# Patient Record
Sex: Male | Born: 1950 | Hispanic: Yes | Marital: Married | Smoking: Former smoker
Health system: Southern US, Community
[De-identification: ages and names within clinical notes are randomized; demographics above are authoritative.]

## PROBLEM LIST (undated history)

## (undated) DIAGNOSIS — N200 Calculus of kidney: Secondary | ICD-10-CM

## (undated) DIAGNOSIS — I10 Essential (primary) hypertension: Secondary | ICD-10-CM

## (undated) DIAGNOSIS — D589 Hereditary hemolytic anemia, unspecified: Secondary | ICD-10-CM

## (undated) HISTORY — DX: Calculus of kidney: N20.0

## (undated) HISTORY — DX: Hereditary hemolytic anemia, unspecified: D58.9

## (undated) HISTORY — PX: CHOLECYSTECTOMY: SHX55

## (undated) HISTORY — PX: SPLENECTOMY: SUR1306

## (undated) HISTORY — PX: APPENDECTOMY: SHX54

## (undated) HISTORY — PX: VOLUME STUDY: SHX6646

---

## 2006-01-03 ENCOUNTER — Ambulatory Visit: Payer: Self-pay | Admitting: Urology

## 2011-05-21 ENCOUNTER — Emergency Department: Payer: Self-pay | Admitting: Emergency Medicine

## 2011-05-21 LAB — BASIC METABOLIC PANEL
BUN: 25 mg/dL — ABNORMAL HIGH (ref 7–18)
Co2: 25 mmol/L (ref 21–32)
EGFR (African American): >60
Glucose: 139 mg/dL — ABNORMAL HIGH (ref 65–99)
Osmolality: 290 (ref 275–301)
Sodium: 142 mmol/L (ref 136–145)

## 2011-05-21 LAB — URINALYSIS, COMPLETE
Glucose,UR: NEGATIVE mg/dL (ref 0–75)
Ketone: NEGATIVE
Leukocyte Esterase: NEGATIVE
RBC,UR: 4 /HPF (ref 0–5)
Specific Gravity: 1.026 (ref 1.003–1.030)
Squamous Epithelial: NONE SEEN
WBC UR: <1 /HPF (ref 0–5)

## 2011-05-21 LAB — CBC
HCT: 31 % — ABNORMAL LOW (ref 40.0–52.0)
MCHC: 33.8 g/dL (ref 32.0–36.0)
Platelet: 255 10*3/uL (ref 150–440)
RBC: 2.88 10*6/uL — ABNORMAL LOW (ref 4.40–5.90)
RDW: 16.1 % — ABNORMAL HIGH (ref 11.5–14.5)
WBC: 9.3 10*3/uL (ref 3.8–10.6)

## 2012-11-22 ENCOUNTER — Ambulatory Visit: Payer: Self-pay | Admitting: Hematology and Oncology

## 2012-11-22 LAB — COMPREHENSIVE METABOLIC PANEL
Alkaline Phosphatase: 76 U/L (ref 50–136)
BUN: 15 mg/dL (ref 7–18)
Bilirubin,Total: 4.8 mg/dL — ABNORMAL HIGH (ref 0.2–1.0)
Chloride: 111 mmol/L — ABNORMAL HIGH (ref 98–107)
Co2: 26 mmol/L (ref 21–32)
EGFR (African American): >60
EGFR (Non-African Amer.): >60
Osmolality: 282 (ref 275–301)
Potassium: 3.8 mmol/L (ref 3.5–5.1)
SGOT(AST): 83 U/L — ABNORMAL HIGH (ref 15–37)
SGPT (ALT): 28 U/L (ref 12–78)
Total Protein: 7.3 g/dL (ref 6.4–8.2)

## 2012-11-22 LAB — CBC CANCER CENTER
Bands: 1 %
Basophil: 1 %
Comment - H1-Com4: NORMAL
Eosinophil: 3 %
HCT: 22.3 % — ABNORMAL LOW (ref 40.0–52.0)
HGB: 7.4 g/dL — ABNORMAL LOW (ref 13.0–18.0)
Lymphocytes: 19 %
MCHC: 33 g/dL (ref 32.0–36.0)
MCV: 124 fL — ABNORMAL HIGH (ref 80–100)
Monocytes: 7 %
NRBC/100 WBC: 17 /100
RBC: 1.79 10*6/uL — ABNORMAL LOW (ref 4.40–5.90)
Segmented Neutrophils: 69 %
WBC: 5.9 x10 3/mm (ref 3.8–10.6)

## 2012-11-22 LAB — RETICULOCYTES: Reticulocyte: >27.4 % (ref 0.4–3.1)

## 2012-11-22 LAB — BILIRUBIN, TOTAL: Bilirubin,Total: 4.9 mg/dL — ABNORMAL HIGH (ref 0.2–1.0)

## 2012-11-22 LAB — BILIRUBIN, DIRECT: Bilirubin, Direct: 0.4 mg/dL — ABNORMAL HIGH (ref 0.00–0.20)

## 2012-11-29 LAB — CBC CANCER CENTER
Basophil #: 0.1 "x10 3/mm "
Basophil %: 0.8 %
Eosinophil #: 0 "x10 3/mm "
Eosinophil %: 0.4 %
HCT: 29.7 % — ABNORMAL LOW
HGB: 9.3 g/dL — ABNORMAL LOW
Lymphocyte %: 24.4 %
Lymphs Abs: 2 "x10 3/mm "
MCH: 38 pg — ABNORMAL HIGH
MCHC: 31.4 g/dL — ABNORMAL LOW
MCV: 121 fL — ABNORMAL HIGH
Monocyte #: 0.6 "x10 3/mm "
Monocyte %: 7.2 %
Neutrophil #: 5.5 "x10 3/mm "
Neutrophil %: 67.2 %
Platelet: 203 "x10 3/mm "
RBC: 2.46 "x10 6/mm " — ABNORMAL LOW
RDW: 21 % — ABNORMAL HIGH
WBC: 8.1 "x10 3/mm "

## 2012-11-29 LAB — BASIC METABOLIC PANEL WITH GFR
Anion Gap: 8
BUN: 23 mg/dL — ABNORMAL HIGH
Calcium, Total: 7.9 mg/dL — ABNORMAL LOW
Chloride: 104 mmol/L
Co2: 29 mmol/L
Creatinine: 0.98 mg/dL
EGFR (African American): >60
EGFR (Non-African Amer.): >60
Glucose: 91 mg/dL
Osmolality: 285
Potassium: 3.2 mmol/L — ABNORMAL LOW
Sodium: 141 mmol/L

## 2012-12-06 LAB — CBC CANCER CENTER
Eosinophil %: 0.3 %
HCT: 34.5 % — ABNORMAL LOW (ref 40.0–52.0)
MCH: 35.9 pg — ABNORMAL HIGH (ref 26.0–34.0)
Monocyte #: 0.3 x10 3/mm (ref 0.2–1.0)
Monocyte %: 4 %
Neutrophil #: 6.4 x10 3/mm (ref 1.4–6.5)
Neutrophil %: 88.5 %
Platelet: 214 x10 3/mm (ref 150–440)
RBC: 3.17 10*6/uL — ABNORMAL LOW (ref 4.40–5.90)
WBC: 7.2 x10 3/mm (ref 3.8–10.6)

## 2012-12-09 ENCOUNTER — Ambulatory Visit: Payer: Self-pay | Admitting: Hematology and Oncology

## 2012-12-13 LAB — CBC CANCER CENTER
Basophil #: 0.1 x10 3/mm (ref 0.0–0.1)
Basophil %: 0.8 %
Eosinophil #: 0.1 x10 3/mm (ref 0.0–0.7)
Eosinophil %: 0.9 %
HCT: 35.3 % — ABNORMAL LOW (ref 40.0–52.0)
Lymphocyte #: 1 x10 3/mm (ref 1.0–3.6)
MCH: 35.2 pg — ABNORMAL HIGH (ref 26.0–34.0)
MCV: 105 fL — ABNORMAL HIGH (ref 80–100)
Monocyte #: 0.5 x10 3/mm (ref 0.2–1.0)
Monocyte %: 5.3 %
Neutrophil #: 6.9 x10 3/mm — ABNORMAL HIGH (ref 1.4–6.5)
Neutrophil %: 81.2 %
WBC: 8.6 x10 3/mm (ref 3.8–10.6)

## 2012-12-27 LAB — CBC CANCER CENTER
Eosinophil #: 0.1 x10 3/mm (ref 0.0–0.7)
Eosinophil %: 1.9 %
HGB: 11.1 g/dL — ABNORMAL LOW (ref 13.0–18.0)
Lymphocyte #: 1.6 x10 3/mm (ref 1.0–3.6)
Lymphocyte %: 25.2 %
MCH: 33.5 pg (ref 26.0–34.0)
MCV: 99 fL (ref 80–100)
Neutrophil %: 66.5 %
Platelet: 305 x10 3/mm (ref 150–440)
RBC: 3.3 10*6/uL — ABNORMAL LOW (ref 4.40–5.90)
RDW: 18.1 % — ABNORMAL HIGH (ref 11.5–14.5)
WBC: 6.2 x10 3/mm (ref 3.8–10.6)

## 2012-12-27 LAB — BASIC METABOLIC PANEL
BUN: 16 mg/dL (ref 7–18)
Chloride: 104 mmol/L (ref 98–107)
Co2: 28 mmol/L (ref 21–32)
Creatinine: 0.82 mg/dL (ref 0.60–1.30)
EGFR (African American): >60
Potassium: 3.4 mmol/L — ABNORMAL LOW (ref 3.5–5.1)
Sodium: 140 mmol/L (ref 136–145)

## 2013-01-09 ENCOUNTER — Ambulatory Visit: Payer: Self-pay | Admitting: Hematology and Oncology

## 2013-01-10 LAB — CBC CANCER CENTER
BASOS ABS: 0.1 x10 3/mm (ref 0.0–0.1)
Basophil %: 0.9 %
EOS PCT: 1.6 %
Eosinophil #: 0.1 x10 3/mm (ref 0.0–0.7)
HCT: 33.5 % — AB (ref 40.0–52.0)
HGB: 11.4 g/dL — ABNORMAL LOW (ref 13.0–18.0)
Lymphocyte #: 1.8 x10 3/mm (ref 1.0–3.6)
Lymphocyte %: 25.4 %
MCH: 33.6 pg (ref 26.0–34.0)
MCHC: 34.1 g/dL (ref 32.0–36.0)
MCV: 99 fL (ref 80–100)
MONO ABS: 0.3 x10 3/mm (ref 0.2–1.0)
Monocyte %: 4.8 %
NEUTROS PCT: 67.3 %
Neutrophil #: 4.8 x10 3/mm (ref 1.4–6.5)
Platelet: 238 x10 3/mm (ref 150–440)
RBC: 3.4 10*6/uL — ABNORMAL LOW (ref 4.40–5.90)
RDW: 18.9 % — ABNORMAL HIGH (ref 11.5–14.5)
WBC: 7.2 x10 3/mm (ref 3.8–10.6)

## 2013-01-24 LAB — CBC CANCER CENTER
Basophil #: 0.1 x10 3/mm (ref 0.0–0.1)
Basophil %: 1.5 %
EOS ABS: 0.2 x10 3/mm (ref 0.0–0.7)
EOS PCT: 2.8 %
HCT: 31.3 % — AB (ref 40.0–52.0)
HGB: 10.5 g/dL — ABNORMAL LOW (ref 13.0–18.0)
LYMPHS ABS: 1.4 x10 3/mm (ref 1.0–3.6)
Lymphocyte %: 16.6 %
MCH: 35 pg — AB (ref 26.0–34.0)
MCHC: 33.5 g/dL (ref 32.0–36.0)
MCV: 104 fL — AB (ref 80–100)
MONOS PCT: 5.4 %
Monocyte #: 0.5 x10 3/mm (ref 0.2–1.0)
NEUTROS PCT: 73.7 %
Neutrophil #: 6.2 x10 3/mm (ref 1.4–6.5)
Platelet: 219 x10 3/mm (ref 150–440)
RBC: 3 10*6/uL — ABNORMAL LOW (ref 4.40–5.90)
RDW: 18.8 % — AB (ref 11.5–14.5)
WBC: 8.4 x10 3/mm (ref 3.8–10.6)

## 2013-02-07 LAB — CBC CANCER CENTER
Basophil #: 0.1 x10 3/mm (ref 0.0–0.1)
Basophil %: 1.3 %
EOS PCT: 1.3 %
Eosinophil #: 0.1 x10 3/mm (ref 0.0–0.7)
HCT: 32.8 % — AB (ref 40.0–52.0)
HGB: 10.6 g/dL — ABNORMAL LOW (ref 13.0–18.0)
LYMPHS PCT: 22.2 %
Lymphocyte #: 1.5 x10 3/mm (ref 1.0–3.6)
MCH: 31.4 pg (ref 26.0–34.0)
MCHC: 32.3 g/dL (ref 32.0–36.0)
MCV: 97 fL (ref 80–100)
Monocyte #: 0.3 x10 3/mm (ref 0.2–1.0)
Monocyte %: 4.9 %
NEUTROS PCT: 70.3 %
Neutrophil #: 4.7 x10 3/mm (ref 1.4–6.5)
PLATELETS: 374 x10 3/mm (ref 150–440)
RBC: 3.37 10*6/uL — AB (ref 4.40–5.90)
RDW: 19 % — ABNORMAL HIGH (ref 11.5–14.5)
WBC: 6.7 x10 3/mm (ref 3.8–10.6)

## 2013-02-07 LAB — COMPREHENSIVE METABOLIC PANEL
ALK PHOS: 70 U/L
Albumin: 4 g/dL (ref 3.4–5.0)
Anion Gap: 3 — ABNORMAL LOW (ref 7–16)
BUN: 17 mg/dL (ref 7–18)
Bilirubin,Total: 1.2 mg/dL — ABNORMAL HIGH (ref 0.2–1.0)
CALCIUM: 8.9 mg/dL (ref 8.5–10.1)
CO2: 29 mmol/L (ref 21–32)
CREATININE: 0.96 mg/dL (ref 0.60–1.30)
Chloride: 106 mmol/L (ref 98–107)
EGFR (African American): >60
EGFR (Non-African Amer.): >60
Glucose: 87 mg/dL (ref 65–99)
Osmolality: 277 (ref 275–301)
Potassium: 3.6 mmol/L (ref 3.5–5.1)
SGOT(AST): 27 U/L (ref 15–37)
SGPT (ALT): 18 U/L (ref 12–78)
Sodium: 138 mmol/L (ref 136–145)
TOTAL PROTEIN: 7.4 g/dL (ref 6.4–8.2)

## 2013-02-09 ENCOUNTER — Ambulatory Visit: Payer: Self-pay | Admitting: Hematology and Oncology

## 2013-02-14 LAB — CBC CANCER CENTER
BASOS ABS: 0 x10 3/mm (ref 0.0–0.1)
BASOS PCT: 0.1 %
Eosinophil #: 0 x10 3/mm (ref 0.0–0.7)
Eosinophil %: 0 %
HCT: 37.2 % — ABNORMAL LOW (ref 40.0–52.0)
HGB: 12.3 g/dL — ABNORMAL LOW (ref 13.0–18.0)
LYMPHS PCT: 4.4 %
Lymphocyte #: 0.4 x10 3/mm — ABNORMAL LOW (ref 1.0–3.6)
MCH: 32.1 pg (ref 26.0–34.0)
MCHC: 33 g/dL (ref 32.0–36.0)
MCV: 97 fL (ref 80–100)
Monocyte #: 0.1 x10 3/mm — ABNORMAL LOW (ref 0.2–1.0)
Monocyte %: 0.6 %
Neutrophil #: 7.9 x10 3/mm — ABNORMAL HIGH (ref 1.4–6.5)
Neutrophil %: 94.9 %
Platelet: 309 x10 3/mm (ref 150–440)
RBC: 3.82 10*6/uL — AB (ref 4.40–5.90)
RDW: 20.1 % — AB (ref 11.5–14.5)
WBC: 8.4 x10 3/mm (ref 3.8–10.6)

## 2013-02-21 LAB — CBC CANCER CENTER
BASOS ABS: 0.1 x10 3/mm (ref 0.0–0.1)
BASOS PCT: 0.9 %
EOS ABS: 0.1 x10 3/mm (ref 0.0–0.7)
EOS PCT: 1.4 %
HCT: 38 % — AB (ref 40.0–52.0)
HGB: 13.3 g/dL (ref 13.0–18.0)
LYMPHS ABS: 1.3 x10 3/mm (ref 1.0–3.6)
Lymphocyte %: 19.7 %
MCH: 34.2 pg — AB (ref 26.0–34.0)
MCHC: 35.1 g/dL (ref 32.0–36.0)
MCV: 98 fL (ref 80–100)
MONO ABS: 0.5 x10 3/mm (ref 0.2–1.0)
Monocyte %: 7 %
Neutrophil #: 4.7 x10 3/mm (ref 1.4–6.5)
Neutrophil %: 71 %
Platelet: 210 x10 3/mm (ref 150–440)
RBC: 3.89 10*6/uL — AB (ref 4.40–5.90)
RDW: 18.6 % — ABNORMAL HIGH (ref 11.5–14.5)
WBC: 6.6 x10 3/mm (ref 3.8–10.6)

## 2013-03-09 ENCOUNTER — Ambulatory Visit: Payer: Self-pay | Admitting: Hematology and Oncology

## 2013-03-28 LAB — CBC CANCER CENTER
BASOS ABS: 0.1 x10 3/mm (ref 0.0–0.1)
Basophil %: 0.8 %
Eosinophil #: 0.1 x10 3/mm (ref 0.0–0.7)
Eosinophil %: 1.2 %
HCT: 32.2 % — AB (ref 40.0–52.0)
HGB: 10.7 g/dL — ABNORMAL LOW (ref 13.0–18.0)
LYMPHS ABS: 1 x10 3/mm (ref 1.0–3.6)
Lymphocyte %: 10.8 %
MCH: 36.2 pg — ABNORMAL HIGH (ref 26.0–34.0)
MCHC: 33.3 g/dL (ref 32.0–36.0)
MCV: 109 fL — ABNORMAL HIGH (ref 80–100)
Monocyte #: 0.3 x10 3/mm (ref 0.2–1.0)
Monocyte %: 2.9 %
NEUTROS ABS: 7.5 x10 3/mm — AB (ref 1.4–6.5)
NEUTROS PCT: 84.3 %
Platelet: 175 x10 3/mm (ref 150–440)
RBC: 2.97 10*6/uL — ABNORMAL LOW (ref 4.40–5.90)
RDW: 17.6 % — ABNORMAL HIGH (ref 11.5–14.5)
WBC: 8.9 x10 3/mm (ref 3.8–10.6)

## 2013-04-09 ENCOUNTER — Ambulatory Visit: Payer: Self-pay | Admitting: Hematology and Oncology

## 2013-05-30 ENCOUNTER — Ambulatory Visit: Payer: Self-pay | Admitting: Hematology and Oncology

## 2013-06-09 ENCOUNTER — Ambulatory Visit: Payer: Self-pay | Admitting: Hematology and Oncology

## 2013-07-24 ENCOUNTER — Ambulatory Visit: Payer: Self-pay | Admitting: Hematology and Oncology

## 2013-08-15 ENCOUNTER — Ambulatory Visit: Payer: Self-pay | Admitting: Internal Medicine

## 2013-08-18 ENCOUNTER — Ambulatory Visit: Payer: Self-pay | Admitting: Hematology and Oncology

## 2013-08-25 ENCOUNTER — Ambulatory Visit: Payer: Self-pay | Admitting: Surgery

## 2013-08-25 DIAGNOSIS — R5383 Other fatigue: Secondary | ICD-10-CM

## 2013-08-25 DIAGNOSIS — R5381 Other malaise: Secondary | ICD-10-CM

## 2013-08-25 DIAGNOSIS — D509 Iron deficiency anemia, unspecified: Secondary | ICD-10-CM

## 2013-08-25 LAB — PROTIME-INR
INR: 1
PROTHROMBIN TIME: 13.2 s (ref 11.5–14.7)

## 2013-08-26 LAB — BASIC METABOLIC PANEL

## 2013-09-02 LAB — CBC CANCER CENTER
BANDS NEUTROPHIL: 1 %
HCT: 30.1 % — ABNORMAL LOW (ref 40.0–52.0)
HGB: 9.9 g/dL — AB (ref 13.0–18.0)
Lymphocytes: 24 %
MCH: 36.5 pg — ABNORMAL HIGH (ref 26.0–34.0)
MCHC: 33 g/dL (ref 32.0–36.0)
MCV: 111 fL — ABNORMAL HIGH (ref 80–100)
MONOS PCT: 5 %
NRBC/100 WBC: 1 /100
Platelet: 320 x10 3/mm (ref 150–440)
RBC: 2.71 10*6/uL — AB (ref 4.40–5.90)
RDW: 20 % — ABNORMAL HIGH (ref 11.5–14.5)
Segmented Neutrophils: 70 %
WBC: 9.8 x10 3/mm (ref 3.8–10.6)

## 2013-09-09 ENCOUNTER — Ambulatory Visit: Payer: Self-pay | Admitting: Hematology and Oncology

## 2013-09-16 ENCOUNTER — Inpatient Hospital Stay: Payer: Self-pay | Admitting: Surgery

## 2013-09-16 LAB — CBC WITH DIFFERENTIAL/PLATELET
BASOS ABS: 0 10*3/uL (ref 0.0–0.1)
Basophil %: 0.1 %
EOS ABS: 0 10*3/uL (ref 0.0–0.7)
Eosinophil %: 0 %
HCT: 32.8 % — ABNORMAL LOW (ref 40.0–52.0)
HGB: 10.8 g/dL — ABNORMAL LOW (ref 13.0–18.0)
LYMPHS PCT: 2.4 %
Lymphocyte #: 0.5 10*3/uL — ABNORMAL LOW (ref 1.0–3.6)
MCH: 34.7 pg — AB (ref 26.0–34.0)
MCHC: 32.9 g/dL (ref 32.0–36.0)
MCV: 106 fL — AB (ref 80–100)
MONOS PCT: 4.5 %
Monocyte #: 0.9 x10 3/mm (ref 0.2–1.0)
NEUTROS ABS: 19.2 10*3/uL — AB (ref 1.4–6.5)
Neutrophil %: 93 %
Platelet: 283 10*3/uL (ref 150–440)
RBC: 3.11 10*6/uL — AB (ref 4.40–5.90)
RDW: 17.8 % — ABNORMAL HIGH (ref 11.5–14.5)
WBC: 20.7 10*3/uL — AB (ref 3.8–10.6)

## 2013-09-17 LAB — CBC WITH DIFFERENTIAL/PLATELET
BASOS ABS: 0 10*3/uL (ref 0.0–0.1)
Basophil %: 0.1 %
EOS PCT: 0.1 %
Eosinophil #: 0 10*3/uL (ref 0.0–0.7)
HCT: 32.8 % — ABNORMAL LOW (ref 40.0–52.0)
HGB: 10.3 g/dL — AB (ref 13.0–18.0)
Lymphocyte #: 0.9 10*3/uL — ABNORMAL LOW (ref 1.0–3.6)
Lymphocyte %: 6.9 %
MCH: 33.5 pg (ref 26.0–34.0)
MCHC: 31.4 g/dL — ABNORMAL LOW (ref 32.0–36.0)
MCV: 107 fL — ABNORMAL HIGH (ref 80–100)
MONO ABS: 0.8 x10 3/mm (ref 0.2–1.0)
Monocyte %: 6.6 %
NEUTROS PCT: 86.3 %
Neutrophil #: 10.7 10*3/uL — ABNORMAL HIGH (ref 1.4–6.5)
Platelet: 289 10*3/uL (ref 150–440)
RBC: 3.07 10*6/uL — ABNORMAL LOW (ref 4.40–5.90)
RDW: 18.2 % — ABNORMAL HIGH (ref 11.5–14.5)
WBC: 12.4 10*3/uL — AB (ref 3.8–10.6)

## 2013-09-17 LAB — BASIC METABOLIC PANEL
ANION GAP: 7 (ref 7–16)
BUN: 12 mg/dL (ref 7–18)
CALCIUM: 8.1 mg/dL — AB (ref 8.5–10.1)
Chloride: 103 mmol/L (ref 98–107)
Co2: 28 mmol/L (ref 21–32)
Creatinine: 0.81 mg/dL (ref 0.60–1.30)
EGFR (African American): >60
Glucose: 124 mg/dL — ABNORMAL HIGH (ref 65–99)
Osmolality: 277 (ref 275–301)
Potassium: 3.8 mmol/L (ref 3.5–5.1)
SODIUM: 138 mmol/L (ref 136–145)

## 2013-09-18 LAB — CBC WITH DIFFERENTIAL/PLATELET
BASOS PCT: 0.1 %
Basophil #: 0 10*3/uL (ref 0.0–0.1)
Eosinophil #: 0 10*3/uL (ref 0.0–0.7)
Eosinophil %: 0 %
HCT: 34.5 % — ABNORMAL LOW (ref 40.0–52.0)
HGB: 11.2 g/dL — AB (ref 13.0–18.0)
LYMPHS PCT: 2 %
Lymphocyte #: 0.2 10*3/uL — ABNORMAL LOW (ref 1.0–3.6)
MCH: 34.7 pg — AB (ref 26.0–34.0)
MCHC: 32.4 g/dL (ref 32.0–36.0)
MCV: 107 fL — AB (ref 80–100)
MONO ABS: 0.2 x10 3/mm (ref 0.2–1.0)
Monocyte %: 2.5 %
NEUTROS ABS: 9.3 10*3/uL — AB (ref 1.4–6.5)
Neutrophil %: 95.4 %
PLATELETS: 332 10*3/uL (ref 150–440)
RBC: 3.22 10*6/uL — ABNORMAL LOW (ref 4.40–5.90)
RDW: 17.4 % — AB (ref 11.5–14.5)
WBC: 9.8 10*3/uL (ref 3.8–10.6)

## 2013-09-18 LAB — CREATININE, SERUM
CREATININE: 0.79 mg/dL (ref 0.60–1.30)
EGFR (African American): >60

## 2013-09-18 LAB — BILIRUBIN, TOTAL: Bilirubin,Total: 0.9 mg/dL (ref 0.2–1.0)

## 2013-09-19 LAB — CBC WITH DIFFERENTIAL/PLATELET
Basophil #: 0 10*3/uL (ref 0.0–0.1)
Basophil %: 0.2 %
Eosinophil #: 0 10*3/uL (ref 0.0–0.7)
Eosinophil %: 0.3 %
HCT: 32.9 % — ABNORMAL LOW (ref 40.0–52.0)
HGB: 11.1 g/dL — ABNORMAL LOW (ref 13.0–18.0)
LYMPHS PCT: 12.7 %
Lymphocyte #: 1.4 10*3/uL (ref 1.0–3.6)
MCH: 35.5 pg — ABNORMAL HIGH (ref 26.0–34.0)
MCHC: 33.8 g/dL (ref 32.0–36.0)
MCV: 105 fL — AB (ref 80–100)
MONOS PCT: 9 %
Monocyte #: 1 x10 3/mm (ref 0.2–1.0)
NEUTROS PCT: 77.8 %
Neutrophil #: 8.5 10*3/uL — ABNORMAL HIGH (ref 1.4–6.5)
Platelet: 363 10*3/uL (ref 150–440)
RBC: 3.13 10*6/uL — AB (ref 4.40–5.90)
RDW: 16.9 % — ABNORMAL HIGH (ref 11.5–14.5)
WBC: 11 10*3/uL — ABNORMAL HIGH (ref 3.8–10.6)

## 2013-09-22 LAB — PATHOLOGY REPORT

## 2013-10-09 ENCOUNTER — Ambulatory Visit: Payer: Self-pay | Admitting: Hematology and Oncology

## 2013-12-10 ENCOUNTER — Ambulatory Visit: Payer: Self-pay | Admitting: Internal Medicine

## 2013-12-28 LAB — COMPREHENSIVE METABOLIC PANEL
AST: 248 U/L — AB (ref 15–37)
Albumin: 3.4 g/dL (ref 3.4–5.0)
Alkaline Phosphatase: 337 U/L — ABNORMAL HIGH
Anion Gap: 9 (ref 7–16)
BUN: 12 mg/dL (ref 7–18)
Bilirubin,Total: 9.9 mg/dL — ABNORMAL HIGH (ref 0.2–1.0)
CALCIUM: 8.9 mg/dL (ref 8.5–10.1)
CHLORIDE: 105 mmol/L (ref 98–107)
CREATININE: 0.9 mg/dL (ref 0.60–1.30)
Co2: 24 mmol/L (ref 21–32)
EGFR (African American): >60
EGFR (Non-African Amer.): >60
Glucose: 126 mg/dL — ABNORMAL HIGH (ref 65–99)
Osmolality: 277 (ref 275–301)
POTASSIUM: 3.4 mmol/L — AB (ref 3.5–5.1)
SGPT (ALT): 283 U/L — ABNORMAL HIGH
Sodium: 138 mmol/L (ref 136–145)
TOTAL PROTEIN: 7.7 g/dL (ref 6.4–8.2)

## 2013-12-28 LAB — LIPASE, BLOOD: Lipase: >10000 U/L — ABNORMAL HIGH (ref 73–393)

## 2013-12-28 LAB — CBC WITH DIFFERENTIAL/PLATELET
Basophil #: 0.2 10*3/uL — ABNORMAL HIGH (ref 0.0–0.1)
Basophil %: 1 %
EOS PCT: 0.1 %
Eosinophil #: 0 10*3/uL (ref 0.0–0.7)
HCT: 35.5 % — AB (ref 40.0–52.0)
HGB: 12.1 g/dL — ABNORMAL LOW (ref 13.0–18.0)
Lymphocyte #: 1.5 10*3/uL (ref 1.0–3.6)
Lymphocyte %: 9 %
MCH: 28.5 pg (ref 26.0–34.0)
MCHC: 34.1 g/dL (ref 32.0–36.0)
MCV: 84 fL (ref 80–100)
Monocyte #: 0.8 x10 3/mm (ref 0.2–1.0)
Monocyte %: 4.6 %
NEUTROS PCT: 85.3 %
Neutrophil #: 14.3 10*3/uL — ABNORMAL HIGH (ref 1.4–6.5)
Platelet: 605 10*3/uL — ABNORMAL HIGH (ref 150–440)
RBC: 4.24 10*6/uL — ABNORMAL LOW (ref 4.40–5.90)
RDW: 19.7 % — AB (ref 11.5–14.5)
WBC: 16.7 10*3/uL — AB (ref 3.8–10.6)

## 2013-12-28 LAB — URINALYSIS, COMPLETE
Blood: NEGATIVE
Glucose,UR: NEGATIVE mg/dL (ref 0–75)
KETONE: NEGATIVE
LEUKOCYTE ESTERASE: NEGATIVE
NITRITE: NEGATIVE
PH: 5 (ref 4.5–8.0)
RBC,UR: 4 /HPF (ref 0–5)
SPECIFIC GRAVITY: 1.018 (ref 1.003–1.030)
Squamous Epithelial: NONE SEEN
WBC UR: 2 /HPF (ref 0–5)

## 2013-12-28 LAB — TROPONIN I: Troponin-I: <0.02 ng/mL

## 2013-12-29 ENCOUNTER — Inpatient Hospital Stay: Payer: Self-pay | Admitting: Surgery

## 2013-12-29 LAB — CBC WITH DIFFERENTIAL/PLATELET
BASOS ABS: 0.1 10*3/uL (ref 0.0–0.1)
Basophil %: 0.8 %
Eosinophil #: 0.1 10*3/uL (ref 0.0–0.7)
Eosinophil %: 0.4 %
HCT: 32.4 % — ABNORMAL LOW (ref 40.0–52.0)
HGB: 11.1 g/dL — ABNORMAL LOW (ref 13.0–18.0)
LYMPHS ABS: 1.4 10*3/uL (ref 1.0–3.6)
LYMPHS PCT: 9.7 %
MCH: 28.6 pg (ref 26.0–34.0)
MCHC: 34.3 g/dL (ref 32.0–36.0)
MCV: 83 fL (ref 80–100)
MONOS PCT: 5.2 %
Monocyte #: 0.8 x10 3/mm (ref 0.2–1.0)
NEUTROS PCT: 83.9 %
Neutrophil #: 12.1 10*3/uL — ABNORMAL HIGH (ref 1.4–6.5)
Platelet: 598 10*3/uL — ABNORMAL HIGH (ref 150–440)
RBC: 3.89 10*6/uL — AB (ref 4.40–5.90)
RDW: 19.5 % — AB (ref 11.5–14.5)
WBC: 14.4 10*3/uL — ABNORMAL HIGH (ref 3.8–10.6)

## 2013-12-29 LAB — HEPATIC FUNCTION PANEL A (ARMC)
ALBUMIN: 2.8 g/dL — AB (ref 3.4–5.0)
ALK PHOS: 290 U/L — AB
BILIRUBIN DIRECT: 4.8 mg/dL — AB (ref 0.0–0.2)
BILIRUBIN TOTAL: 6.5 mg/dL — AB (ref 0.2–1.0)
SGOT(AST): 150 U/L — ABNORMAL HIGH (ref 15–37)
SGPT (ALT): 216 U/L — ABNORMAL HIGH
Total Protein: 6.9 g/dL (ref 6.4–8.2)

## 2013-12-29 LAB — LIPASE, BLOOD: Lipase: 4750 U/L — ABNORMAL HIGH (ref 73–393)

## 2013-12-30 LAB — CBC WITH DIFFERENTIAL/PLATELET
Basophil #: 0.2 10*3/uL — ABNORMAL HIGH (ref 0.0–0.1)
Basophil %: 1 %
EOS PCT: 0 %
Eosinophil #: 0 10*3/uL (ref 0.0–0.7)
HCT: 31.6 % — ABNORMAL LOW (ref 40.0–52.0)
HGB: 10.7 g/dL — ABNORMAL LOW (ref 13.0–18.0)
LYMPHS PCT: 7.5 %
Lymphocyte #: 1.2 10*3/uL (ref 1.0–3.6)
MCH: 28.6 pg (ref 26.0–34.0)
MCHC: 34 g/dL (ref 32.0–36.0)
MCV: 84 fL (ref 80–100)
Monocyte #: 0.9 x10 3/mm (ref 0.2–1.0)
Monocyte %: 5.7 %
NEUTROS ABS: 14 10*3/uL — AB (ref 1.4–6.5)
Neutrophil %: 85.8 %
Platelet: 579 10*3/uL — ABNORMAL HIGH (ref 150–440)
RBC: 3.75 10*6/uL — AB (ref 4.40–5.90)
RDW: 19.5 % — AB (ref 11.5–14.5)
WBC: 16.3 10*3/uL — ABNORMAL HIGH (ref 3.8–10.6)

## 2013-12-30 LAB — COMPREHENSIVE METABOLIC PANEL
ALBUMIN: 2.9 g/dL — AB (ref 3.4–5.0)
ALK PHOS: 324 U/L — AB
ALT: 185 U/L — AB
AST: 120 U/L — AB (ref 15–37)
Anion Gap: 9 (ref 7–16)
BUN: 14 mg/dL (ref 7–18)
Bilirubin,Total: 7.7 mg/dL — ABNORMAL HIGH (ref 0.2–1.0)
CHLORIDE: 105 mmol/L (ref 98–107)
CREATININE: 0.84 mg/dL (ref 0.60–1.30)
Calcium, Total: 8.6 mg/dL (ref 8.5–10.1)
Co2: 25 mmol/L (ref 21–32)
GLUCOSE: 86 mg/dL (ref 65–99)
OSMOLALITY: 277 (ref 275–301)
POTASSIUM: 3.3 mmol/L — AB (ref 3.5–5.1)
SODIUM: 139 mmol/L (ref 136–145)
Total Protein: 6.8 g/dL (ref 6.4–8.2)

## 2013-12-30 LAB — LIPASE, BLOOD: LIPASE: 3313 U/L — AB (ref 73–393)

## 2013-12-31 LAB — CBC WITH DIFFERENTIAL/PLATELET
BASOS ABS: 1 %
Bands: 1 %
Eosinophil: 2 %
HCT: 31.2 % — ABNORMAL LOW (ref 40.0–52.0)
HGB: 10.3 g/dL — ABNORMAL LOW (ref 13.0–18.0)
LYMPHS PCT: 14 %
MCH: 28.6 pg (ref 26.0–34.0)
MCHC: 33.1 g/dL (ref 32.0–36.0)
MCV: 87 fL (ref 80–100)
Monocytes: 3 %
NRBC/100 WBC: 4 /
Platelet: 574 10*3/uL — ABNORMAL HIGH (ref 150–440)
RBC: 3.6 10*6/uL — ABNORMAL LOW (ref 4.40–5.90)
RDW: 20 % — AB (ref 11.5–14.5)
Segmented Neutrophils: 79 %
WBC: 12.1 10*3/uL — AB (ref 3.8–10.6)

## 2013-12-31 LAB — COMPREHENSIVE METABOLIC PANEL
ALBUMIN: 2.7 g/dL — AB (ref 3.4–5.0)
ALT: 129 U/L — AB
Alkaline Phosphatase: 237 U/L — ABNORMAL HIGH
Anion Gap: 8 (ref 7–16)
BUN: 10 mg/dL (ref 7–18)
Bilirubin,Total: 2.7 mg/dL — ABNORMAL HIGH (ref 0.2–1.0)
Calcium, Total: 7.9 mg/dL — ABNORMAL LOW (ref 8.5–10.1)
Chloride: 107 mmol/L (ref 98–107)
Co2: 24 mmol/L (ref 21–32)
Creatinine: 0.84 mg/dL (ref 0.60–1.30)
EGFR (Non-African Amer.): >60
Glucose: 67 mg/dL (ref 65–99)
Osmolality: 275 (ref 275–301)
Potassium: 3.5 mmol/L (ref 3.5–5.1)
SGOT(AST): 71 U/L — ABNORMAL HIGH (ref 15–37)
SODIUM: 139 mmol/L (ref 136–145)
Total Protein: 6.3 g/dL — ABNORMAL LOW (ref 6.4–8.2)

## 2013-12-31 LAB — LIPASE, BLOOD: Lipase: 157 U/L (ref 73–393)

## 2014-01-02 LAB — CULTURE, BLOOD (SINGLE)

## 2014-01-09 ENCOUNTER — Ambulatory Visit: Payer: Self-pay | Admitting: Internal Medicine

## 2014-03-27 ENCOUNTER — Ambulatory Visit
Admit: 2014-03-27 | Disposition: A | Discharge status: Home / Self Care | Payer: Self-pay | Attending: Internal Medicine | Admitting: Internal Medicine

## 2014-05-02 NOTE — Discharge Summary (Signed)
PATIENT NAME:  Anthony Castaneda, Anthony Castaneda MR#:  [id number] DATE OF BIRTH:  [birth year]  DATE OF ADMISSION:  12/29/2013 DATE OF DISCHARGE:  01/01/2014.   DIAGNOSES: Biliary pancreatitis, choledocholithiasis, and jaundice.   PROCEDURES: ERCP, laparoscopic cholecystectomy with cholangiography.   CONSULTANTS: Internal medicine,  primary doctor and [facility].   HISTORY OF PRESENT ILLNESS:  This is a patient who is status post recent splenectomy by Dr. Marina Gravel, who presents with right upper quadrant pain epigastric pain and a work-up showing biliary pancreatitis with choledocholithiasis.    HOSPITAL COURSE:  An ERCP was performed by Dr. Allen Norris with stone extraction and he was taken to the Operating Room for laparoscopic cholecystectomy with cholangiography. Cholangiography demonstrated no intraluminal filling defects and good flow in the duodenum. Postoperatively, the patient wanted to go home prior to Christmas and was feeling well, tolerating a diet and was discharged in stable condition on oral analgesics, to follow up in my office in 10 days. He is also given instructions to return should he have any return of his symptoms or dizziness etc.  He understood and agreed to proceed.  ____________________________ Jerrol Banana Burt Knack, MD rec:at D: 01/01/2014 13:31:44 ET T: 01/01/2014 14:43:01 ET JOB#: [id number]  cc: Jerrol Banana. Burt Knack, MD, <Dictator> Florene Glen MD ELECTRONICALLY SIGNED 01/01/2014 15:57

## 2014-05-02 NOTE — Consult Note (Signed)
PATIENT NAME:  Anthony Castaneda, Anthony Castaneda MR#:  [id number] DATE OF BIRTH:  [birth year]  DATE OF CONSULTATION:  09/17/2013  CONSULTING PHYSICIAN:  Simonne Come. Gittin, MD  HISTORY OF PRESENT ILLNESS: Anthony Castaneda is a 64 year old patient, well known to me and had been followed regularly in the cancer center, who I am asked to see now postoperatively after splenectomy in followup of autoimmune hemolytic anemia. The patient has been followed regularly in the clinic, has a long-standing diagnosis of autoimmune hemolytic anemia. He has been steroid responsive, but steroid dependent and he had required 30 mg of prednisone to maintain the hemoglobin, so it was recognized that long-term prednisone was not the ideal treatment. It is recommended for immunotherapy with rituximab or splenectomy. Either were appropriate options. He was actually recommended rituximab to avoid the surgical risks. He did not want the immunotherapy and therefore was evaluated and prepared for a splenectomy.   He was evaluated and ruled out for any other underlying disorder. He had flow cytometry and CT scanning negative for any signs of lymphoma. He had a mildly increased spleen size compatible with his autoimmune hemolytic anemia. He had negative serologies for HIV or hepatitis. He had appropriate presurgical vaccination with Pneumovax, HIB and meningitis vaccination. He had a bone marrow examination that also ruled out underlying disorder. 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   PAST MEDICAL HISTORY: Is really negative for significant or active other medical problems.   PRIOR MEDICATIONS: Have been prednisone only.   FAMILY HISTORY: Noncontributory.   SOCIAL HISTORY: Negative for alcohol or tobacco.   REVIEW OF SYSTEMS: When seen, the system review was really unremarkable. He was doing remarkably well, was up and out of bed with minimal surgical, less than typical discomfort. He denied  headache, dizziness, chills, sweats, cough, wheezing, retrosternal pain, palpitations. No vomiting. No diarrhea. No extremity edema. No bone pain. No rash or bruising.   PHYSICAL EXAMINATION: GENERAL: He was alert and cooperative.  HEENT: Sclerae clear. Mouth: No thrush.  LYMPHATIC: No palpable lymph nodes in the neck, supraclavicular, submandibular or axilla.  LUNGS: Clear.  HEART: Regular.  ABDOMEN: Exam was deferred to surgery the surgical site was unremarkable.  EXTREMITIES: No edema.  NEUROLOGIC: Grossly nonfocal.   LABORATORY DATA: On 09/08, the hemoglobin was 10. On 09/09, the hemoglobin was 10.3. On 09/09 the white count was 12.4. The platelets were 289,000. The creatinine was 0.81. The glucose was 124. Calcium was 8.1.   IMPRESSION AND PLAN: The patient is postoperative splenectomy. Blood counts holding... too soon to know the ultimate result.. Anticipate that white count will come down and that hemoglobin will rise. Plans are to give him prednisone at a rapid taper. He was on 30 mg daily and immediately cut him to 20 and then to 10 and then off in the next day or 2 and then as an outpatient, we will follow the hemoglobin. Later we will recheck Coombs test, reticulocytes, bilirubin, LDH and all other measures of hemolysis and then follow the patient over time. Hopefully, he will have a complete response and be steroid independent. Potentially, he will have a partial response and require a low dose, but a safer level of prednisone, for maintenance. At the time of this evaluation, the splenic pathology is not available, but subsequently it showed no pathology except for an enlarged spleen and features compatible with autoimmune hemolytic anemia and corticosteroid effect and negative for malignancy.     ____________________________  Simonne Come Inez Pilgrim, MD rgg:TT D: 09/28/2013 16:38:35 ET T: 09/28/2013 19:17:59 ET JOB#: [id number]  cc: Simonne Come. Inez Pilgrim, MD, <Dictator> Dallas Schimke  MD ELECTRONICALLY SIGNED 10/07/2013 14:15

## 2014-05-02 NOTE — Op Note (Signed)
PATIENT NAME:  BARI, HANDSHOE MR#:  [id number] DATE OF BIRTH:  09-19-1950  DATE OF PROCEDURE:  01/01/2014  PREOPERATIVE DIAGNOSIS: Biliary pancreatitis.   POSTOPERATIVE DIAGNOSIS: Biliary pancreatitis.   PROCEDURE: Laparoscopic cholecystectomy with C-arm fluoroscopic cholangiography.   SURGEON: Annaly Skop E. Burt Knack, M.D.   ANESTHESIA: General with endotracheal tube.   INDICATIONS: This is a patient with documented biliary pancreatitis. He has had an ERCP with stone extraction and his lipase has returned to normal. He is here for completion laparoscopic cholecystectomy with cholangiography.   Preoperatively, we discussed the rationale for surgery, and the options of observation. The risks were detailed by Dr. Marina Gravel in detail, and this was reviewed. He had no questions in the preoperative holding area. He prefers to go home after this procedure.   FINDINGS: C-arm fluoroscopic cholangiography demonstrated good flow in the duodenum. No intraluminal filling defects. The proximal ducts were well identified.   There were adhesions in the left upper quadrant that did not inhibit or involve any of the port sites.   DESCRIPTION OF PROCEDURE: The patient was induced to general anesthesia. He was on IV antibiotics. VTE prophylaxis was in place. He was prepped and draped in a sterile fashion. Marcaine was infiltrated in the skin and subcutaneous tissues around the supraumbilical area. Incision was made. Veress needle was placed. Pneumoperitoneum was obtained. A 5 mm trocar port was placed. The abdominal cavity was explored, and under direct vision, a 12 mm epigastric port and 2 lateral 5 mm ports were placed. The gallbladder was placed on tension. Adhesions were taken down bluntly. The peritoneum over the infundibulum was incised bluntly. The cystic duct and gallbladder junction was well identified. The cystic lymphatics were doubly clipped and divided. This allowed for good visualization of the cystic duct as it  entered the infundibulum of the gallbladder. Here it was clipped and incised, and through a separate incision, an Angiocath cholangiogram catheter was placed. C-arm fluoroscopic cholangiography demonstrated the above. The cholangiogram catheter was removed. The cystic duct was doubly clipped and divided, and then the gallbladder was taken from the gallbladder fossa with electrocautery after doubly clipping and dividing the cystic artery. The gallbladder was passed out through the epigastric port site with the aid of an Endo Catch bag. The area was checked for hemostasis and found to be adequate. Hemostasis was maintained with minimal electrocautery. (Dictation Anomaly)  irrigation was performed, which was aspirated. Again, no sign of bile leak, bowel injury or bleeding. Therefore, with the camera in the epigastric site viewing back to the periumbilical site, no adhesions were noted in the periumbilical area, nor were there any signs of bowel injury.   Therefore, pneumoperitoneum was released. All ports were removed. Fascial edges at the epigastric site were approximated with figure-of-eight 0 Vicryls; 4-0 subcuticular Monocryl was used at all skin edges. Steri-Strips, Mastisol and sterile dressings were placed.   The patient tolerated the procedure well. There were no complications. He was taken to the recovery room in stable condition to be admitted for continued care.    ____________________________ Jerrol Banana. Burt Knack, MD rec:JT D: 01/01/2014 08:16:42 ET T: 01/01/2014 11:39:50 ET JOB#: [id number]  cc: Jerrol Banana. Burt Knack, MD, <Dictator> Florene Glen MD ELECTRONICALLY SIGNED 01/01/2014 14:19

## 2014-05-02 NOTE — Discharge Summary (Signed)
PATIENT NAME:  Anthony Castaneda, Anthony Castaneda MR#:  [id number] DATE OF BIRTH:  02/06/1950  DATE OF ADMISSION:  09/16/2013 DATE OF DISCHARGE:  09/19/2013  FINAL DIAGNOSIS: Autoimmune hemolytic anemia, steroid dependent.   PRINCIPAL PROCEDURE: Splenectomy.   HOSPITAL COURSE SUMMARY: The patient was admitted following a splenectomy on  09/08. Postoperatively, he did well. His diet was able to be advanced. He was seen by Dr. Inez Pilgrim for management of his prednisone as an outpatient. His hemoglobin remained stable. By  postoperative day #3 the patient was tolerating a regular diet, adequate pain control and was discharged home in stable condition on 30 mg a day of prednisone and acetaminophen oxycodone 5/325 mg 1 tab every 6 hours as needed for moderate pain. He will follow up with me in the office in 1 week and Dr. Inez Pilgrim as an outpatient.     ____________________________ Jeannette How. Marina Gravel, MD mab:ts D: 09/21/2013 00:22:16 ET T: 09/21/2013 04:12:01 ET JOB#: [id number]  cc: Elta Guadeloupe A. Marina Gravel, MD, <Dictator> Hortencia Conradi MD ELECTRONICALLY SIGNED 09/22/2013 11:21

## 2014-05-02 NOTE — Consult Note (Signed)
Brief Consult Note: Diagnosis: pancreatitis, ?choledocholithiasis.   Patient was seen by consultant.   Comments: Anthony Castaneda is a 64 y/o hispanic male with waxing & waning mid-abdominal/back pain, nausea & vomiting over past 2 weeks.  Aram Beecham, [facility] interpreter was utilized).  Lipase greater than 10,000 consistent with pancreatitis, likely secondary to gallstones.  No hx hyerlipidemia or ETOH.  Ultrasound shows cholelithiasis, sludge, & mild gallbladder wall thickening, as well as an 66mm CBD & echogenic changes in right hepatic lobe.  TB 9.9, ALP 337, AST 248 & ALT 283.  WBC 16.7, platelets 605 last night.  He has been started on IV antibiotics-Zosyn & vancomycin.  Plan:  1) NPO 2) Supportive measures, pain control, aggressive IVFs 3) STAT lipase, LFTS, CBC 4) MRCP which can visualize CBD, right hepatic lobe & pancreas vs ERCP today.  Will discuss with Dr Allen Norris  5) Agree with antibiotic coverage 6) Pt is on heparin which is contraindicated in acute pancreatitis, will discontinue & order SCDs Thanks for allowing [place] to participate in his care.  Please see full dictated note. #[id number].  Electronic Signatures: Andria Meuse (NP)  (Signed 21-Dec-15 12:48)  Authored: Brief Consult Note   Last Updated: 21-Dec-15 12:48 by Andria Meuse (NP)

## 2014-05-02 NOTE — Op Note (Signed)
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   POSTOPERATIVE DIAGNOSIS: Steroid-resistant autoimmune hemolytic anemia.   PROCEDURE PERFORMED: Splenectomy.   SURGEON: Bradyn Soward A. Marina Gravel, MD  ASSISTANT: Jerrol Banana. Burt Knack, MD   TYPE OF ANESTHESIA: General endotracheal.   FINDINGS: Moderate splenomegaly with splenic volume roughly twice the size of a normal spleen. There was no evidence of accessory splenules.   ESTIMATED BLOOD LOSS: 500 mL.   DRAINS: None.   LAP AND NEEDLE COUNT: Correct x2.   DESCRIPTION OF PROCEDURE: With the patient in the supine position, general endotracheal anesthesia was induced, and with the help of a beanbag and arm board, the patient was positioned in 45 degree left-sided oblique position, and the table was flexed. The patient's abdomen was widely clipped of hair, sterilely prepped and draped with ChloraPrep solution, and a timeout was observed.   A Veress needle was placed in the left pararectus space 8 cm above the umbilicus, and this was accomplished with incision through the skin and subcutaneous tissues with a scalpel, elevation of the fascia with a tracheal hook and confirmation of the drop test. Then, 3 liters of  CO2 was insufflated. Following this, an Applied 10 mm non-bladed trocar was placed utilizing the camera for visualization of the tissues. Confirmation of the intraperitoneal position was confirmed. Pneumoperitoneum was then established to 15 mmHg with CO2. Remaining trocars were placed, 8.5 da Vinci trocar in the epigastric region just to the right of the midline, an additional 8.5 mm da Vinci in the left anterior axillary line 2 cm below the costal margin, 10 mm assistant port was then placed in the left lower quadrant. The da Vinci patient cart was then brought to the table  over the left shoulder, docked. Instruments were then inserted under direct visualization. I then moved to the console.   Dissection was then undertaken initially along the gastrosplenic ligament, with several short gastrics being divided with the da Vinci LigaSure apparatus. Attachments of the spleen to the diaphragm and the splenic flexure being delivered with sharp dissection and point cautery utilizing sharp scissors. After approximately 30 minutes of dissection, I encountered some brisk bleeding from just on the undersurface of the lower portion of the splenic pole. Attempt at controlling this with the 10 mm Hemoclip applier was accomplished. Further dissection demonstrated continued bleeding from the splenic capsule in this area, which made further robotic attempt at laparoscopic splenectomy, after approximately 500 mL of blood loss, unfeasible. As such, the patient cart was then undocked. A left upper quadrant incision was then fashioned transversely just below the costal margin, rectus muscle being divided. The spleen was then delivered by the remaining attachments being divided with electrocautery. Two additional short gastrics were divided between clamps and ties of #0 silk suture and the use of the LigaSure apparatus. The splenic hilum was identified. Two Kelly clamps were then placed well away from the tip of the tail of the pancreas. The spleen was then delivered off the field and submitted as specimen. The splenic hilum was controlled utilizing 2 suture ligatures of #0 silk suture. Hemostasis at this point appeared to be excellent. The left upper quadrant was irrigated with approximately a liter of normal saline, and again hemostasis with application of dry lap pad demonstrated no evidence of hemorrhage. Again, the tail of the pancreas appeared to be unharmed.  Further search in the area of the splenic hilum and omentum demonstrated no evidence of accessory spleens.   With the lap and needle count  correct x2, the fascia was then closed in 2 layers utilizing running #1 Vicryl sutures. The port sites were then closed with figure-of-eight #0 Vicryl suture. Skin edges were reapproximated utilizing a skin stapler, and a total of 30 mL of 0.25% plain Marcaine was infiltrated prior to application of dressings. The patient was then returned to supine, extubated and taken to the recovery room in stable and satisfactory condition by anesthesia services.    ____________________________ Jeannette How Marina Gravel, MD mab:lb D: 09/18/2013 09:29:26 ET T: 09/18/2013 09:47:10 ET JOB#: [id number]  cc: Elta Guadeloupe A. Marina Gravel, MD, <Dictator> Hortencia Conradi MD ELECTRONICALLY SIGNED 09/19/2013 21:56

## 2014-05-02 NOTE — Consult Note (Signed)
Brief Consult Note: Diagnosis: biliary pancreatitis.   Patient was seen by consultant.   Consult note dictated.   Recommend to proceed with surgery or procedure.   Discussed with Attending MD.   Comments: Discussed with pt, family and Ms Ronnald Ramp via interpretor. Agree with ERCP/stone extraction and lap chole once able. Risks discussed as well as rationjale.  Electronic Signatures: Florene Glen (MD)  (Signed 21-Dec-15 17:30)  Authored: Brief Consult Note   Last Updated: 21-Dec-15 17:30 by Florene Glen (MD)

## 2014-05-02 NOTE — Consult Note (Signed)
Brief Consult Note: Diagnosis: AUTOIMMUNE HEMOLYTIC ANEMIA, S/PSPLENECTOMY.   Patient was seen by consultant.   Discussed with Attending MD.   Comments: PATIENT SEEN, CHART REVIEWED, WELL KNOWN TO ME, AS PRIOR DOCUMENTED IN CLINIC, HX AUTOIMMINE HEMOLYTIC ANEMIA, STEROID RESPONSIVE BUT DEPENDANT, RECENTLY HGB DROPPING ON PRED 20MG , REQUIRED 30MG  PO TO KEEP HGB 9.5 TO 10.5, PRIOR AT 7.5 AND SYMPTOMATIC. TX INDICATED AS JUNTENABLE FOR HIGH DOSE PREDNISONE LONG TERM. PATIENT CHOSE SPLENECTOMY OVER RITUXIMAB. HE HAD PNEUMOVAC, HIB, AND MENINGITIS VACCINATION 2 WEEKS PRIRO TO SURGERY. HE UNDERWENT CT SCANNING AND BM EXAM, WITH NO OTHER PATHOLOLOGY DETECTED, SOME MINOR DYSPLASTIC CHANGERS CONSISTENT WITH HIGH CELL TURNOVER NOTED.  POST OP UNREMARKABLE. HE SHOULD HAVE CBC MONITORED, ALSO CAN FOLLOW LDH, BILI, LATER COOMBS TEST, FOR SIGNS OF PERSISTENT OR RESOLVED HEMOLYSIS. HE CAN BE TAPERED TO PREDNISON 20MG  PO DAILY X 3 DAYS THEN 10 MG DAILY. LATER IN CANCER CENTER CAN DETERMINE IF SPLENECTOMY SUCCESSFUL, IF HE CAN BE WEANED OFF OR WILL REQUIRE  LOW DOSE MAINMTAINANCE PREDNISONE, AND IF PREDNISONE STOPPED WILL F/U OVER TIME FOR POSSIBLE RELAPSE. WILL F/U SPLENIC PATHO;LOGY.  DICTATED NOTE TO FOLLOW.  Electronic Signatures: Dallas Schimke (MD)  (Signed 09-Sep-15 17:21)  Authored: Brief Consult Note   Last Updated: 09-Sep-15 17:21 by Dallas Schimke (MD)

## 2014-05-04 LAB — SURGICAL PATHOLOGY

## 2014-05-06 NOTE — H&P (Signed)
PATIENT NAME:  Anthony Castaneda, Anthony Castaneda MR#:  [id number] DATE OF BIRTH:  Aug 12, 1950  DATE OF ADMISSION:  12/29/2013  REFERRING PHYSICIAN: Loney Hering, MD  PRIMARY CARE PHYSICIAN: Nonlocal.   ADMISSION DIAGNOSIS: Choledocholithiasis.   HISTORY OF PRESENT ILLNESS: This is a 64 year old Hispanic male who presents to the Emergency Department complaining of abdominal pain x1 week. The patient states that 1 week prior to admission he had excruciating abdominal pain in his mid-epigastric region radiating into his chest. He had multiple episodes of nonbloody vomit, but noticed that it was very green in color. He admits that after he stopped vomiting, his pain subsided and he was able to go to work all week without any pain, fever, nausea, or vomiting. However, on the night of admission, his abdomen began to hurt again. He denies any chest pain or shortness of breath. He has felt nauseous, but has not vomited tonight. In the Emergency Department, an ultrasound of his abdomen revealed an enlarged common bile duct, which is indicative of possible choledocholithiasis, which prompted the Emergency Department to call for admission.  REVIEW OF SYSTEMS: CONSTITUTIONAL: The patient denies fever or weakness.  EYES: Denies inflammation or blurred vision.  EARS, NOSE, AND THROAT: Denies tinnitus or sore throat.  RESPIRATORY: Denies cough or shortness of breath.  CARDIOVASCULAR: Denies chest pain or palpitations.  GASTROINTESTINAL: Admits to nausea, but denies vomiting or diarrhea.  GENITOURINARY: Denies dysuria, increased frequency, or hesitancy of urination.  ENDOCRINE: Denies polyuria or polydipsia. HEMATOLOGIC AND LYMPHATIC: Denies easy bruising or bleeding.  INTEGUMENTARY: Denies rashes or lesions.  MUSCULOSKELETAL: Denies myalgias or arthralgias.  NEUROLOGIC: Denies numbness in his extremities or dysarthria.  PSYCHIATRIC: Denies depression or suicidal ideation.   PAST MEDICAL HISTORY: He has had anemia.    PAST SURGICAL HISTORY: The patient has had splenomegaly.   SOCIAL HISTORY: The patient does not smoke or do any illegal drugs. He occasionally drinks alcohol.   FAMILY HISTORY: There is some diabetes within the family.   MEDICATIONS: None.   ALLERGIES: No known drug allergies.  PERTINENT LABORATORY RESULTS AND RADIOGRAPHIC FINDINGS: Serum glucose is 126, BUN 12, creatinine 0.9. Serum sodium is 138, potassium 3.4, chloride 105, bicarbonate 24, calcium is 8.9. Lipase is more than 10,000. Serum albumin is 3.4, total bilirubin 9.9, alkaline phosphatase 337, AST 248, ALT 283. Troponin is negative. White blood cell count 16.7, hemoglobin 12.1, hematocrit 35.5. Platelet count is 605,000. MCV is 84. Urinalysis is negative for infection. Ultrasound shows mild gallbladder distention with multiple small stones and sludging. The patient did have a positive Murphy sign during exam and common bile duct was dilated to 0.8 cm. He also has some vague areas of echogenicity within the right hepatic lobe of uncertain significance.   PHYSICAL EXAMINATION: VITAL SIGNS: Temperature is 98, pulse 52, respirations 18, blood pressure 143/75, pulse oximetry 97% on room air.  GENERAL: The patient is alert and oriented x3, in no apparent distress.  HEENT: Normocephalic, atraumatic. Pupils equal, round, and reactive to light and accommodation. Extraocular movements are intact. Mucous membranes are moist.  NECK: Trachea is midline. No adenopathy.  CHEST: Symmetric and atraumatic.  CARDIOVASCULAR: Regular rate and rhythm. Normal S1, S2. No rubs, clicks, or murmurs appreciated.  LUNGS: Clear to auscultation bilaterally. Normal effort and excursion.  ABDOMEN: Positive bowel sounds. Soft, diffusely tender, but more so over the right upper quadrant with voluntary guarding, but no rebound tenderness. There is no hepatomegaly. There is a left upper quadrant scar consistent with splenectomy.  GENITOURINARY:  Deferred.   MUSCULOSKELETAL: The patient moves all 4 extremities equally. There is 5/5 strength in upper and lower extremities bilaterally.  SKIN: No rashes or lesions.  EXTREMITIES: No clubbing, cyanosis, or edema.  NEUROLOGIC: Cranial nerves II through XII are grossly intact.  PSYCHIATRIC: Mood is normal. Affect is congruent.   ASSESSMENT AND PLAN: This is a 64 year old male with choledocholithiasis.  1.  Choledocholithiasis. Common bile duct is mildly dilated. I have ordered a GI consult for an ERCP. We will manage his pain and nausea.  2.  Sepsis. The patient meets criteria by leukocytosis and tachypnea. He is hemodynamically stable. We have obtained blood cultures. I have started him on vancomycin and Zosyn.  3.  Pancreatitis. This is associated with his biliary obstruction. I have made the patient n.p.o. and we will aggressively hydrate him.  4.  Transaminitis secondary to his biliary cholestasis.  5.  Jaundice. The patient's total bilirubin is 9.9 and is obstructive in etiology. 6.  Deep vein thrombosis prophylaxis. Heparin.  7.  Gastrointestinal prophylaxis. None.  CODE STATUS: The patient is a full code.  TIME SPENT ON ADMISSION ORDERS AND PATIENT CARE: Approximately 35 minutes.    ____________________________ Norva Riffle. Marcille Blanco, MD msd:sw D: 12/29/2013 04:38:10 ET T: 12/29/2013 07:03:36 ET JOB#: [id number]  cc: Norva Riffle. Marcille Blanco, MD, <Dictator> Norva Riffle Sathvika Ojo MD ELECTRONICALLY SIGNED 01/14/2014 0:37

## 2014-05-06 NOTE — Consult Note (Signed)
PATIENT NAME:  Anthony Castaneda, Anthony Castaneda MR#:  [id number] DATE OF BIRTH:  1950-04-06  DATE OF CONSULTATION:  12/29/2013  REFERRING PHYSICIAN:  Norva Riffle. Marcille Blanco, MD CONSULTING PHYSICIAN:  Andria Meuse, NP  GASTROENTEROLOGIST: Lucilla Lame, MD   REASON FOR CONSULTATION: Acute cholecystitis.   HISTORY OF PRESENT ILLNESS: Anthony Castaneda is a pleasant, 64 year old Hispanic male who was admitted for sepsis and started on vancomycin and Zosyn. He has had vomiting and epigastric pain for the last 2 weeks. He describes the pain as very severe. It radiates to his back. It starts in his mid abdomen. Pain is 10 out of 10 on a pain scale. It usually comes and goes every hour to hour and a half, and can last hours at times. He states it is not necessarily associated with certain foods or eating. He had an ultrasound, which showed his common bile duct was 8 mm; gallstones and sludge and mild gallbladder distention, gallbladder wall thickening and also an echogenic right hepatic lobe. He had a lipase of over 10,000. He denies any alcohol use. His total bilirubin was 9.9, alkaline phosphatase 337, AST 248, ALT 283, white blood cell count of 16.7, hemoglobin 12.1 with a platelet count of 605,000. He denies any history of hyperlipidemia. He denies any previous history of pancreatitis.   PAST MEDICAL AND SURGICAL HISTORY: Idiopathic Coombs-positive hemolytic anemia, status post splenectomy, which resolved his anemia per his report. He has never had a colonoscopy nor EGD.   MEDICATIONS PRIOR TO ADMISSION: Denies any.   ALLERGIES: Denies any.   FAMILY HISTORY: There is no known family history of colon carcinoma, liver or chronic GI problems.   SOCIAL HISTORY: He lives with his wife. He has 3 children. 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 He denies any tobacco, alcohol or illicit drug use.     REVIEW OF SYSTEMS: See HPI; otherwise negative 12-point review of systems.   PHYSICAL EXAMINATION:  VITAL SIGNS:  BMI 25.8, height 65.9 inches,  weight 159.8 pounds. Temperature 97.6, pulse 81.8, blood pressure 145/71, oxygen saturation 96% on room air.  GENERAL: He is a well-developed, well-nourished Hispanic male in no acute distress. He is accompanied by his wife and his mother. HEENT: Sclerae clear, nonicteric. Conjunctivae pink. Oropharynx pink and moist, without any lesions.  NECK: Supple, without any mass or thyromegaly.  HEART: Regular rate and rhythm. Normal S1, S2. No murmurs, clicks, rubs, or gallops.  LUNGS: Clear to auscultation bilaterally.  ABDOMEN: Faint bowel sounds x 4. Abdomen is soft, mildly distended. He does have upper abdominal pain bilaterally, without rebound tenderness or guarding. He does have well healed scars from splenectomy in the left upper quadrant. Unable to palpate hepatosplenomegaly.  EXTREMITIES: Without clubbing or edema.  SKIN: Pink, warm and dry without any rash or jaundice.  NEUROLOGIC: Grossly intact.  MUSCULOSKELETAL: Good equal movement and strength bilaterally.  RECTAL: Deferred.  PSYCHIATRIC: Alert, cooperative. Normal mood and affect.   Aram Beecham, [facility] interpreter, was utilized throughout visit.)   LABORATORY STUDIES: See HPI. BMP was normal, except for a potassium of 3.4 and a glucose of 126 yesterday. Total protein 6.9, albumin 2.8. Hemoglobin 12.1, hematocrit 35.5. Blood cultures without any growth in 8-12 hours. Urinalysis shows 2+ bilirubin, no blood, no LE, and trace bacteria.   IMPRESSION: Anthony Castaneda is a 64 year old Hispanic male with waxing and waning mid abdominal pain/back pain, and nausea and vomiting over the past 2 weeks. His lipase is greater than 10,000, consistent with acute pancreatitis, likely secondary to gallstones. No  history of hyperlipidemia or alcohol. His ultrasound shows cholelithiasis, sludge, and mild gallbladder wall thickening as well as an 8 mm common bile duct and echogenic changes in the right hepatic lobe. His total bilirubin was 9.9, alkaline phosphatase 337, AST  248, and ALT 283. His white blood cell count of 16.7. His platelet 605 last night. I have ordered STAT labs this morning. He has been started on IV antibiotics, Zosyn and vancomycin.   PLAN:  1.  N.p.o.  2.  Supportive measures including pain control, aggressive IV fluids, and antiemetics.  3.  STAT lipase, LFTs, and CBC.  4.  Agree with Protonix.  5.  MRCP, which can visualize the common bile duct, right hepatic lobe, and pancreas today STAT.  6.  Agree with antibiotic coverage.  7.  The patient is on heparin, which is contraindicated in acute pancreatitis, and therefore this will be discontinued and SCDs will be ordered.   Thank you for allowing [place] to participate in his care.   I have discussed his care with Dr. Tressia Miners and Dr. Allen Norris, and all are in agreement with the plan.    ____________________________ Andria Meuse, NP klj:MT D: 12/29/2013 12:57:11 ET T: 12/29/2013 13:22:43 ET JOB#: [id number]  cc: Andria Meuse, NP, <Dictator> Andria Meuse FNP ELECTRONICALLY SIGNED 01/21/2014 14:59

## 2014-06-19 ENCOUNTER — Encounter (INDEPENDENT_AMBULATORY_CARE_PROVIDER_SITE_OTHER): Payer: Self-pay

## 2014-06-19 ENCOUNTER — Ambulatory Visit: Payer: Self-pay | Admitting: Internal Medicine

## 2014-06-19 ENCOUNTER — Inpatient Hospital Stay: Payer: 59 | Attending: Hematology and Oncology | Admitting: Hematology and Oncology

## 2014-06-19 ENCOUNTER — Encounter: Payer: Self-pay | Admitting: Hematology and Oncology

## 2014-06-19 VITALS — BP 143/81 | HR 62 | Temp 97.2°F | Resp 16 | Wt 174.2 lb

## 2014-06-19 DIAGNOSIS — D649 Anemia, unspecified: Secondary | ICD-10-CM | POA: Diagnosis not present

## 2014-06-19 DIAGNOSIS — Z87442 Personal history of urinary calculi: Secondary | ICD-10-CM | POA: Diagnosis not present

## 2014-06-19 DIAGNOSIS — Z87891 Personal history of nicotine dependence: Secondary | ICD-10-CM | POA: Insufficient documentation

## 2014-06-19 DIAGNOSIS — Z9081 Acquired absence of spleen: Secondary | ICD-10-CM | POA: Insufficient documentation

## 2014-06-19 DIAGNOSIS — R17 Unspecified jaundice: Secondary | ICD-10-CM | POA: Insufficient documentation

## 2014-06-19 DIAGNOSIS — D589 Hereditary hemolytic anemia, unspecified: Secondary | ICD-10-CM | POA: Diagnosis not present

## 2014-06-19 NOTE — Progress Notes (Signed)
[facility] day:  06/19/2014  Chief Complaint: Anthony Castaneda is a 64 y.o. male  With a history of a Coombs positive hemolytic anemia who is seen for reassessment.  HPI:   The patient was initially diagnosed with Coombs positive hemolytic anemia in 11/2012. He presented with the jaundice and anemia to the health center on 11/12/2012. CBC included a hematocrit of 20, hemoglobin 6.5, MCV 115, with normal white count and platelet count. Bilirubin was 4.4 with an indirect fraction of 3.67. Haptoglobin was less than 10. LDH was 1098.   He responded to steroids. With the taper of his steroids, his hematocrit dropped requiring reinstitution of full dose prednisone. Taper again resulted in his hematocrit again dropping. Decision was made to institute second line therapy.  Discussions were held regarding Rituxan or possible splenectomy.  Chest and pelvic CT scans revealed no pathology. Bone marrow revealed some dysplastic changes attributed to the high red cell turnover.   He underwent splenectomy in 09/16/2013 by Dr. Sherri Rad. Pathology was benign.  He received vaccinations prior to splenectomy.   He states that he also underwent cholecystectomy for gallstones on 12/28/2013.   Snce his splenectomy, the patient has had no trouble with his blood counts.  Labs from on 06/05/2014 included a hematocrit of 47.1, hemoglobin 16, MCV 89, platelets 391,000, white count 6800 with an ANC of 3900. Bilirubin was 0.4. LDH was 242. Labs from 03/27/2014 included a normal CBC, bilirubin, and LDH 247.  Symptomatically, he denies any fevers, sweats or weight loss.  He has never had a colonoscopy.  Past Medical History  Diagnosis Date  . Hemolytic anemia   . Nephrolithiasis     Past Surgical History  Procedure Laterality Date  . Cholecystectomy    . Splenectomy      No family history on file.  Social History:  reports that he has quit smoking. He does not have any  smokeless tobacco history on file. His alcohol and drug histories are not on file.  The patient is accompanied by the interpreter today.  Allergies: Not on File  Current Medications: No current outpatient prescriptions on file.   No current facility-administered medications for this visit.    Review of Systems:  GENERAL:  Feels good.  Active.  No fevers or sweats.  Weight up and down. PERFORMANCE STATUS (ECOG):  0 HEENT:  No visual changes, runny nose, sore throat, mouth sores or tenderness. Lungs: No shortness of breath or cough.  No hemoptysis. Cardiac:  No chest pain, palpitations, orthopnea, or PND. GI:  No nausea, vomiting, diarrhea, constipation, melena or hematochezia. GU:  No urgency, frequency, dysuria, or hematuria. Musculoskeletal:  No back pain.  No joint pain.  No muscle tenderness. Extremities:  No pain or swelling. Skin:  No rashes or skin changes. Neuro:  No headache, numbness or weakness, balance or coordination issues. Endocrine:  No diabetes, thyroid issues, hot flashes or night sweats. Psych:  No mood changes, depression or anxiety. Pain:  No focal pain. Review of systems:  All other systems reviewed and found to be negative.   Physical Exam: Blood pressure 143/81, pulse 62, temperature 97.2 F (36.2 C), temperature source Tympanic, resp. rate 16, weight 174 lb 2.6 oz (79 kg). GENERAL:  Well developed, well nourished, sitting comfortably in the exam room in no acute distress. MENTAL STATUS:  Alert and oriented to person, place and time. HEAD:  Black hair.  Normocephalic, atraumatic, face symmetric, no Cushingoid features.  EYES:  Glasses. Brown eyes.  Pupils equal round and reactive to light and accomodation.  No conjunctivitis or scleral icterus. ENT:  Oropharynx clear without lesion.  Tongue normal. Mucous membranes moist.  RESPIRATORY:  Clear to auscultation without rales, wheezes or rhonchi. CARDIOVASCULAR:  Regular rate and rhythm without murmur, rub or  gallop. ABDOMEN:  Soft, non-tender, with active bowel sounds, and no hepatosmegaly.  Status post splenectomy.  No masses. SKIN:  No rashes, ulcers or lesions. EXTREMITIES: No edema, no skin discoloration or tenderness.  No palpable cords. LYMPH NODES: No palpable cervical, supraclavicular, axillary or inguinal adenopathy  NEUROLOGICAL: Unremarkable. PSYCH:  Appropriate.  No visits with results within 3 Day(s) from this visit. Latest known visit with results is:  [facility] Conversion on 12/29/2013  Component Date Value Ref Range Status  . Troponin-I 12/28/2013 < 0.02   Final   Comment: 0.00-0.05 0.05 ng/mL or less: NEGATIVE  Repeat testing in 3-6 hrs  if clinically indicated. >0.05 ng/mL: POTENTIAL  MYOCARDIAL INJURY. Repeat  testing in 3-6 hrs if  clinically indicated. NOTE: An increase or decrease  of 30% or more on serial  testing suggests a  clinically important change   . WBC 12/28/2013 16.7* 3.8-10.6 x10 3/mm 3 Final  . RBC 12/28/2013 4.24* 4.40-5.90 x10 6/mm 3 Final  . HGB 12/28/2013 12.1* 13.0-18.0 g/dL Final  . HCT 12/28/2013 35.5* 40.0-52.0 % Final  . MCV 12/28/2013 84  80-100 fL Final  . MCH 12/28/2013 28.5  26.0-34.0 pg Final  . MCHC 12/28/2013 34.1  32.0-36.0 g/dL Final  . RDW 12/28/2013 19.7* 11.5-14.5 % Final  . Platelet 12/28/2013 605* 150-440 x10 3/mm 3 Final  . Neutrophil % 12/28/2013 85.3   Final  . Lymphocyte % 12/28/2013 9.0   Final  . Monocyte % 12/28/2013 4.6   Final  . Eosinophil % 12/28/2013 0.1   Final  . Basophil % 12/28/2013 1.0   Final  . Neutrophil # 12/28/2013 14.3* 1.4-6.5 x10 3/mm 3 Final  . Lymphocyte # 12/28/2013 1.5  1.0-3.6 x10 3/mm 3 Final  . Monocyte # 12/28/2013 0.8  0.2-1.0 x10 3/mm  Final  . Eosinophil # 12/28/2013 0.0  0.0-0.7 x10 3/mm 3 Final  . Basophil # 12/28/2013 0.2* 0.0-0.1 x10 3/mm 3 Final  . Glucose 12/28/2013 126* 65-99 mg/dL Final  . BUN 12/28/2013 12  7-18 mg/dL Final  . Creatinine 12/28/2013 0.90  0.60-1.30 mg/dL Final   . Sodium 12/28/2013 138  136-145 mmol/L Final  . Potassium 12/28/2013 3.4* 3.5-5.1 mmol/L Final  . Chloride 12/28/2013 105  98-107 mmol/L Final  . Co2 12/28/2013 24  21-32 mmol/L Final  . Calcium, Total 12/28/2013 8.9  8.5-10.1 mg/dL Final  . SGOT(AST) 12/28/2013 248* 15-37 Unit/L Final  . SGPT (ALT) 12/28/2013 283*  Final   Comment: 14-63 NOTE: New Reference Range 07/29/13   . Alkaline Phosphatase 12/28/2013 337*  Final   Comment: 46-116 NOTE: New Reference Range 07/29/13   . Albumin 12/28/2013 3.4  3.4-5.0 g/dL Final  . Total Protein 12/28/2013 7.7  6.4-8.2 g/dL Final  . Bilirubin,Total 12/28/2013 9.9* 0.2-1.0 mg/dL Final  . Osmolality 12/28/2013 277  275-301 Final  . Anion Gap 12/28/2013 9  7-16 Final  . EGFR (African American) 12/28/2013 >60  >60m/min Final  . EGFR (Non-African Amer.) 12/28/2013 >60  >634mmin Final   Comment: eGFR values <6066min/1.73 m2 may be an indication of chronic kidney disease (CKD). Calculated eGFR, using the MRDR Study equation, is useful in  patients with stable renal function. The  eGFR calculation will not be reliable in acutely ill patients when serum creatinine is changing rapidly. It is not useful in patients on dialysis. The eGFR calculation may not be applicable to patients at the low and high extremes of body sizes, pregnant women, and vegetarians.   . Lipase 12/28/2013 > 10000* 73-393 Unit/L Final  . Color - urine 12/28/2013 Amber   Final  . Clarity - urine 12/28/2013 Clear   Final  . [place] Meth 12/28/2013 Negative  0-75 mg/dL Final  . Bilirubin,UR 12/28/2013 2+  NEGATIVE Final  . Ketone 12/28/2013 Negative  NEGATIVE Final  . Specific Gravity 12/28/2013 1.018  1.003-1.030 Final  . Blood 12/28/2013 Negative  NEGATIVE Final  . Ph 12/28/2013 5.0  4.5-8.0 Final  . Protein 12/28/2013 30 mg/dL  NEGATIVE Final  . Nitrite 12/28/2013 Negative  NEGATIVE Final  . Leukocyte Esterase 12/28/2013 Negative  NEGATIVE Final  . RBC,UR 12/28/2013 4  /HPF  0-5 /HPF Final  . WBC UR 12/28/2013 2 /HPF  0-5 /HPF Final  . Bacteria 12/28/2013 TRACE  NONE SEEN Final  . Squamous Epithelial 12/28/2013 NONE SEEN   Final  . Mucous 12/28/2013 PRESENT   Final  . Micro Text Report 12/28/2013    Final                   Value:   COMMENT                   NO GROWTH AEROBICALLY/ANAEROBICALLY IN 5 DAYS   ANTIBIOTIC                                                      . Micro Text Report 12/28/2013    Final                   Value:   COMMENT                   NO GROWTH AEROBICALLY/ANAEROBICALLY IN 5 DAYS   ANTIBIOTIC                                                      . Lipase 12/29/2013 4750* 73-393 Unit/L Final  . SGOT(AST) 12/29/2013 150* 15-37 Unit/L Final  . SGPT (ALT) 12/29/2013 216*  Final   Comment: 14-63 NOTE: New Reference Range 07/29/13   . Alkaline Phosphatase 12/29/2013 290*  Final   Comment: 46-116 NOTE: New Reference Range 07/29/13   . Albumin 12/29/2013 2.8* 3.4-5.0 g/dL Final  . Total Protein 12/29/2013 6.9  6.4-8.2 g/dL Final  . Bilirubin,Total 12/29/2013 6.5* 0.2-1.0 mg/dL Final  . Bilirubin, Direct 12/29/2013 4.8* 0.0-0.2 mg/dL Final  . WBC 12/29/2013 14.4* 3.8-10.6 x10 3/mm 3 Final  . RBC 12/29/2013 3.89* 4.40-5.90 x10 6/mm 3 Final  . HGB 12/29/2013 11.1* 13.0-18.0 g/dL Final  . HCT 12/29/2013 32.4* 40.0-52.0 % Final  . MCV 12/29/2013 83  80-100 fL Final  . MCH 12/29/2013 28.6  26.0-34.0 pg Final  . MCHC 12/29/2013 34.3  32.0-36.0 g/dL Final  . RDW 12/29/2013 19.5* 11.5-14.5 % Final  . Platelet 12/29/2013 598* 150-440 x10 3/mm 3 Final  . Neutrophil % 12/29/2013 83.9   Final  .  Lymphocyte % 12/29/2013 9.7   Final  . Monocyte % 12/29/2013 5.2   Final  . Eosinophil % 12/29/2013 0.4   Final  . Basophil % 12/29/2013 0.8   Final  . Neutrophil # 12/29/2013 12.1* 1.4-6.5 x10 3/mm 3 Final  . Lymphocyte # 12/29/2013 1.4  1.0-3.6 x10 3/mm 3 Final  . Monocyte # 12/29/2013 0.8  0.2-1.0 x10 3/mm  Final  . Eosinophil #  12/29/2013 0.1  0.0-0.7 x10 3/mm 3 Final  . Basophil # 12/29/2013 0.1  0.0-0.1 x10 3/mm 3 Final  . Lipase 12/30/2013 3313* 73-393 Unit/L Final  . Glucose 12/30/2013 86  65-99 mg/dL Final  . BUN 12/30/2013 14  7-18 mg/dL Final  . Creatinine 12/30/2013 0.84  0.60-1.30 mg/dL Final  . Sodium 12/30/2013 139  136-145 mmol/L Final  . Potassium 12/30/2013 3.3* 3.5-5.1 mmol/L Final  . Chloride 12/30/2013 105  98-107 mmol/L Final  . Co2 12/30/2013 25  21-32 mmol/L Final  . Calcium, Total 12/30/2013 8.6  8.5-10.1 mg/dL Final  . SGOT(AST) 12/30/2013 120* 15-37 Unit/L Final  . SGPT (ALT) 12/30/2013 185*  Final   Comment: 14-63 NOTE: New Reference Range 07/29/13   . Alkaline Phosphatase 12/30/2013 324*  Final   Comment: 46-116 NOTE: New Reference Range 07/29/13   . Albumin 12/30/2013 2.9* 3.4-5.0 g/dL Final  . Total Protein 12/30/2013 6.8  6.4-8.2 g/dL Final  . Bilirubin,Total 12/30/2013 7.7* 0.2-1.0 mg/dL Final  . Osmolality 12/30/2013 277  275-301 Final  . Anion Gap 12/30/2013 9  7-16 Final  . EGFR (African American) 12/30/2013 >60  >31m/min Final  . EGFR (Non-African Amer.) 12/30/2013 >60  >641mmin Final   Comment: eGFR values <6034min/1.73 m2 may be an indication of chronic kidney disease (CKD). Calculated eGFR, using the MRDR Study equation, is useful in  patients with stable renal function. The eGFR calculation will not be reliable in acutely ill patients when serum creatinine is changing rapidly. It is not useful in patients on dialysis. The eGFR calculation may not be applicable to patients at the low and high extremes of body sizes, pregnant women, and vegetarians.   . WBC 12/30/2013 16.3* 3.8-10.6 x10 3/mm 3 Final  . RBC 12/30/2013 3.75* 4.40-5.90 x10 6/mm 3 Final  . HGB 12/30/2013 10.7* 13.0-18.0 g/dL Final  . HCT 12/30/2013 31.6* 40.0-52.0 % Final  . MCV 12/30/2013 84  80-100 fL Final  . MCH 12/30/2013 28.6  26.0-34.0 pg Final  . MCHC 12/30/2013 34.0  32.0-36.0 g/dL  Final  . RDW 12/30/2013 19.5* 11.5-14.5 % Final  . Platelet 12/30/2013 579* 150-440 x10 3/mm 3 Final  . Neutrophil % 12/30/2013 85.8   Final  . Lymphocyte % 12/30/2013 7.5   Final  . Monocyte % 12/30/2013 5.7   Final  . Eosinophil % 12/30/2013 0.0   Final  . Basophil % 12/30/2013 1.0   Final  . Neutrophil # 12/30/2013 14.0* 1.4-6.5 x10 3/mm 3 Final  . Lymphocyte # 12/30/2013 1.2  1.0-3.6 x10 3/mm 3 Final  . Monocyte # 12/30/2013 0.9  0.2-1.0 x10 3/mm  Final  . Eosinophil # 12/30/2013 0.0  0.0-0.7 x10 3/mm 3 Final  . Basophil # 12/30/2013 0.2* 0.0-0.1 x10 3/mm 3 Final  . Glucose 12/31/2013 67  65-99 mg/dL Final  . BUN 12/31/2013 10  7-18 mg/dL Final  . Creatinine 12/31/2013 0.84  0.60-1.30 mg/dL Final  . Sodium 12/31/2013 139  136-145 mmol/L Final  . Potassium 12/31/2013 3.5  3.5-5.1 mmol/L Final  . Chloride 12/31/2013 107  98-107 mmol/L Final  .  Co2 12/31/2013 24  21-32 mmol/L Final  . Calcium, Total 12/31/2013 7.9* 8.5-10.1 mg/dL Final  . SGOT(AST) 12/31/2013 71* 15-37 Unit/L Final  . SGPT (ALT) 12/31/2013 129*  Final   Comment: 14-63 NOTE: New Reference Range 07/29/13   . Alkaline Phosphatase 12/31/2013 237*  Final   Comment: 46-116 NOTE: New Reference Range 07/29/13   . Albumin 12/31/2013 2.7* 3.4-5.0 g/dL Final  . Total Protein 12/31/2013 6.3* 6.4-8.2 g/dL Final  . Bilirubin,Total 12/31/2013 2.7* 0.2-1.0 mg/dL Final  . Osmolality 12/31/2013 275  275-301 Final  . Anion Gap 12/31/2013 8  7-16 Final  . EGFR (African American) 12/31/2013 >60  >98m/min Final  . EGFR (Non-African Amer.) 12/31/2013 >60  >626mmin Final   Comment: eGFR values <6023min/1.73 m2 may be an indication of chronic kidney disease (CKD). Calculated eGFR, using the MRDR Study equation, is useful in  patients with stable renal function. The eGFR calculation will not be reliable in acutely ill patients when serum creatinine is changing rapidly. It is not useful in patients on dialysis. The eGFR  calculation may not be applicable to patients at the low and high extremes of body sizes, pregnant women, and vegetarians.   . Lipase 12/31/2013 157  73-393 Unit/L Final  . WBC 12/31/2013 12.1* 3.8-10.6 x10 3/mm 3 Final  . RBC 12/31/2013 3.60* 4.40-5.90 x10 6/mm 3 Final  . HGB 12/31/2013 10.3* 13.0-18.0 g/dL Final  . HCT 12/31/2013 31.2* 40.0-52.0 % Final  . MCV 12/31/2013 87  80-100 fL Final  . MCH 12/31/2013 28.6  26.0-34.0 pg Final  . MCHC 12/31/2013 33.1  32.0-36.0 g/dL Final  . RDW 12/31/2013 20.0* 11.5-14.5 % Final  . Platelet 12/31/2013 574* 150-440 x10 3/mm 3 Final   Comment: Blood smear from 12/31/13 and previous history reviewed. 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 The patient is post-splenectomy, and there are Howell-Jolly bodies; the increased platelet count is also likely due to the splenectomy in September 2015. Hematology consultation is recommended. M OBlain PaisD. NRBCS - PATHOLOGIST TO REVIEW SMEAR. COMMENTS  - APPEAR ON REPORT WHEN COMPLETE.   . Bands 12/31/2013 1   Final  . Segmented Neutrophils 12/31/2013 79   Final  . Lymphocytes 12/31/2013 14   Final  . Monocytes 12/31/2013 3   Final  . Eosinophil 12/31/2013 2   Final  . Basophil 12/31/2013 1   Final  . NRBC/100 WBC 12/31/2013 4   Final  . Comment - H1-Com1 12/31/2013 ANISOCYTOSIS   Final  . Comment - H1-Com2 12/31/2013 POLYCHROMASIA   Final  . Comment - H1-Com3 12/31/2013 MICROCYTES PRESENT   Final  . Comment - H1-Com4 12/31/2013 PLTS VARIED IN SIZE   Final  . SURGICAL PATHOLOGY 01/01/2014    Final                   Value:Surgical Procedure CASE: [id number] PATIENT: LEOJeni Sallesrgical Pathology Report     SPECIMEN SUBMITTED: A. Gallbladder  CLINICAL HISTORY: None provided  PRE-OPERATIVE DIAGNOSIS: choledocholithiasis  POST-OPERATIVE DIAGNOSIS: same/lap chole.     DIAGNOSIS: A. GALLBLADDER;  CHOLECYSTECTOMY: - CHOLELITHIASIS, CHRONIC CHOLECYSTITIS, AND CHOLESTEROLOSIS.   GROSS DESCRIPTION: A. Measurements: 7.6 x 3.5 x 2.2 cm Previously opened: No External surface: Pink-tan with fibrous adhesions Wall thickness: 0.5 cm Mucosa: Red and diffusely stippled yellow Stones present: Yes, aggregate 3.5 x 2.5 x 0.3 cm, granular black Other findings: Cystic duct margin inked green  Block summary: 1-representative perpendicular cystic duct margin 2-representative sections  of the wall  Final Diagnosis performed by Bryan Lemma, MD.  Electronically signed 01/05/2014 1:36:12PM    The electronic signature indicates that the named Attending Pathologist has evaluated the spe                         cimen  Technical component performed at [facility], [address] [place], [place] [address] Lab: [phone] Dir: Darrick Penna. Evette Doffing, MD  Professional component performed at [facility], [facility], [place], [place], [place] [address] Lab: [phone] Dir: Dellia Nims. Rubinas, MD      Assessment:  Anthony Castaneda is a 64 y.o. male with a history of a Coombs positive hemolytic anemia diagnosed in 11/l2014. He responded well to prednisone, but with tapering doses develop recurrent hemolysis.  Chest,  abdomen and pelvic CT scan revealed no abnormality. Bone marrow was unrevealing. 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   Symptomatically, he is doing well. Exam reveals no hepatomegaly or adenopathy. Counts are normal.   Plan: 1.  Review entire medical history, diagnosis of hemolytic anemia and treatment. 2.  Review labs from 06/05/2014. 3.  Discuss need for screening colonoscopy (has never had a colonoscopy). 4.  Discuss need for immediate evaluation if a fever as he has had a splenectomy. 5.  Return to clinic in 6 months for MD assessment and review of [facility] labs (CBC with differential, CMP, LDH, and uric acid).   Lequita Asal, MD  06/19/2014, 4:30 PM

## 2014-06-29 ENCOUNTER — Encounter: Payer: Self-pay | Admitting: Hematology and Oncology

## 2014-12-22 ENCOUNTER — Other Ambulatory Visit: Payer: Self-pay

## 2014-12-22 DIAGNOSIS — D593 Hemolytic-uremic syndrome, unspecified: Secondary | ICD-10-CM

## 2014-12-25 ENCOUNTER — Inpatient Hospital Stay: Payer: 59 | Attending: Hematology and Oncology

## 2014-12-25 ENCOUNTER — Inpatient Hospital Stay (HOSPITAL_BASED_OUTPATIENT_CLINIC_OR_DEPARTMENT_OTHER): Payer: 59 | Admitting: Hematology and Oncology

## 2014-12-25 VITALS — BP 127/77 | HR 64 | Temp 96.5°F | Resp 18 | Ht 66.0 in | Wt 176.1 lb

## 2014-12-25 DIAGNOSIS — D599 Acquired hemolytic anemia, unspecified: Secondary | ICD-10-CM

## 2014-12-25 DIAGNOSIS — D589 Hereditary hemolytic anemia, unspecified: Secondary | ICD-10-CM | POA: Insufficient documentation

## 2014-12-25 DIAGNOSIS — R7989 Other specified abnormal findings of blood chemistry: Secondary | ICD-10-CM

## 2014-12-25 DIAGNOSIS — Z87442 Personal history of urinary calculi: Secondary | ICD-10-CM | POA: Insufficient documentation

## 2014-12-25 DIAGNOSIS — Z87891 Personal history of nicotine dependence: Secondary | ICD-10-CM

## 2014-12-25 DIAGNOSIS — Z9081 Acquired absence of spleen: Secondary | ICD-10-CM | POA: Insufficient documentation

## 2014-12-25 NOTE — Progress Notes (Signed)
[facility] day:  12/25/2014   Chief Complaint: Anthony Castaneda is a 64 y.o. male with a history of a Coombs positive hemolytic anemia who is seen for 6 month assessment.  HPI:   The patient was last seen in the medical oncology clinic on 06/19/2014.  At that time, he was doing well. Exam revealed no hepatomegaly or adenopathy. Counts were normal. We discussed the need for screening colonoscopy.  We discussed general health issues associated with a splenectomy.  [facility] labs from 12/18/2014 revealed a hematocrit of 45.1, hemoglobin 15.6, MCV 87, white blood count 8500 with an ANC of 5400.  Creatinine was 0.81.  Bilirubin was 0.3 and LDH 196.  Alkaline phosphatase was 141 (39-117) with a prior value of 324 on 12/30/2013, 237 on 12/31/2013, 247 on 03/27/2014, and 242 on 06/05/2014.  During the interim, he notes no problems.  He states that his PSA is "ok".  He states he is in the process of "working on a colonoscopy".   Past Medical History  Diagnosis Date  . Hemolytic anemia   . Nephrolithiasis     Past Surgical History  Procedure Laterality Date  . Cholecystectomy    . Splenectomy      No family history on file.  Social History:  reports that he has quit smoking. He does not have any smokeless tobacco history on file. His alcohol and drug histories are not on file.  The patient is accompanied by the interpreter today.  Allergies: Not on File  Current Medications: No current outpatient prescriptions on file.   No current facility-administered medications for this visit.    Review of Systems:  GENERAL:  Feels good.  No problems.  No fevers or sweats.  Weight up and down. PERFORMANCE STATUS (ECOG):  0 HEENT:  No visual changes, runny nose, sore throat, mouth sores or tenderness. Lungs: No shortness of breath or cough.  No hemoptysis. Cardiac:  No chest pain, palpitations, orthopnea, or PND. GI:  No nausea, vomiting, diarrhea,  constipation, melena or hematochezia. GU:  No urgency, frequency, dysuria, or hematuria. Musculoskeletal:  No back pain.  No joint pain.  No muscle tenderness. Extremities:  No pain or swelling. Skin:  No rashes or skin changes. Neuro:  No headache, numbness or weakness, balance or coordination issues. Endocrine:  No diabetes, thyroid issues, hot flashes or night sweats. Psych:  No mood changes, depression or anxiety. Pain:  No focal pain. Review of systems:  All other systems reviewed and found to be negative.   Physical Exam: Blood pressure 127/77, pulse 64, temperature 96.5 F (35.8 C), temperature source Tympanic, resp. rate 18, height '5\' 6"'  (1.676 m), weight 176 lb 2.4 oz (79.9 kg). GENERAL:  Well developed, well nourished, sitting comfortably in the exam room in no acute distress. MENTAL STATUS:  Alert and oriented to person, place and time. HEAD:  Short black hair.  Normocephalic, atraumatic, face symmetric, no Cushingoid features. EYES:  Brown eyes.  Pupils equal round and reactive to light and accomodation.  No conjunctivitis or scleral icterus. ENT:  Oropharynx clear without lesion.  Tongue normal. Mucous membranes moist.  RESPIRATORY:  Clear to auscultation without rales, wheezes or rhonchi. CARDIOVASCULAR:  Regular rate and rhythm without murmur, rub or gallop. ABDOMEN:  Soft, non-tender, with active bowel sounds, and no hepatosmegaly.  Status post splenectomy.  No masses. SKIN:  No rashes, ulcers or lesions. EXTREMITIES: No edema, no skin discoloration or tenderness.  No palpable cords.  LYMPH NODES: No palpable cervical, supraclavicular, axillary or inguinal adenopathy  NEUROLOGICAL: Unremarkable. PSYCH:  Appropriate.  No visits with results within 3 Day(s) from this visit. Latest known visit with results is:  [facility] Conversion on 12/29/2013  Component Date Value Ref Range Status  . Troponin-I 12/28/2013 < 0.02   Final   Comment: 0.00-0.05 0.05 ng/mL or less: NEGATIVE   Repeat testing in 3-6 hrs  if clinically indicated. >0.05 ng/mL: POTENTIAL  MYOCARDIAL INJURY. Repeat  testing in 3-6 hrs if  clinically indicated. NOTE: An increase or decrease  of 30% or more on serial  testing suggests a  clinically important change   . WBC 12/28/2013 16.7* 3.8-10.6 x10 3/mm 3 Final  . RBC 12/28/2013 4.24* 4.40-5.90 x10 6/mm 3 Final  . HGB 12/28/2013 12.1* 13.0-18.0 g/dL Final  . HCT 12/28/2013 35.5* 40.0-52.0 % Final  . MCV 12/28/2013 84  80-100 fL Final  . MCH 12/28/2013 28.5  26.0-34.0 pg Final  . MCHC 12/28/2013 34.1  32.0-36.0 g/dL Final  . RDW 12/28/2013 19.7* 11.5-14.5 % Final  . Platelet 12/28/2013 605* 150-440 x10 3/mm 3 Final  . Neutrophil % 12/28/2013 85.3   Final  . Lymphocyte % 12/28/2013 9.0   Final  . Monocyte % 12/28/2013 4.6   Final  . Eosinophil % 12/28/2013 0.1   Final  . Basophil % 12/28/2013 1.0   Final  . Neutrophil # 12/28/2013 14.3* 1.4-6.5 x10 3/mm 3 Final  . Lymphocyte # 12/28/2013 1.5  1.0-3.6 x10 3/mm 3 Final  . Monocyte # 12/28/2013 0.8  0.2-1.0 x10 3/mm  Final  . Eosinophil # 12/28/2013 0.0  0.0-0.7 x10 3/mm 3 Final  . Basophil # 12/28/2013 0.2* 0.0-0.1 x10 3/mm 3 Final  . Glucose 12/28/2013 126* 65-99 mg/dL Final  . BUN 12/28/2013 12  7-18 mg/dL Final  . Creatinine 12/28/2013 0.90  0.60-1.30 mg/dL Final  . Sodium 12/28/2013 138  136-145 mmol/L Final  . Potassium 12/28/2013 3.4* 3.5-5.1 mmol/L Final  . Chloride 12/28/2013 105  98-107 mmol/L Final  . Co2 12/28/2013 24  21-32 mmol/L Final  . Calcium, Total 12/28/2013 8.9  8.5-10.1 mg/dL Final  . SGOT(AST) 12/28/2013 248* 15-37 Unit/L Final  . SGPT (ALT) 12/28/2013 283*  Final   Comment: 14-63 NOTE: New Reference Range 07/29/13   . Alkaline Phosphatase 12/28/2013 337*  Final   Comment: 46-116 NOTE: New Reference Range 07/29/13   . Albumin 12/28/2013 3.4  3.4-5.0 g/dL Final  . Total Protein 12/28/2013 7.7  6.4-8.2 g/dL Final  . Bilirubin,Total 12/28/2013 9.9* 0.2-1.0  mg/dL Final  . Osmolality 12/28/2013 277  275-301 Final  . Anion Gap 12/28/2013 9  7-16 Final  . EGFR (African American) 12/28/2013 >60  >53m/min Final  . EGFR (Non-African Amer.) 12/28/2013 >60  >648mmin Final   Comment: eGFR values <6016min/1.73 m2 may be an indication of chronic kidney disease (CKD). Calculated eGFR, using the MRDR Study equation, is useful in  patients with stable renal function. The eGFR calculation will not be reliable in acutely ill patients when serum creatinine is changing rapidly. It is not useful in patients on dialysis. The eGFR calculation may not be applicable to patients at the low and high extremes of body sizes, pregnant women, and vegetarians.   . Lipase 12/28/2013 > 10000* 73-393 Unit/L Final  . Color - urine 12/28/2013 Amber   Final  . Clarity - urine 12/28/2013 Clear   Final  . GluSeward Meth/20/2015 Negative  0-75 mg/dL Final  . Bilirubin,UR 12/28/2013 2+  NEGATIVE  Final  . Ketone 12/28/2013 Negative  NEGATIVE Final  . Specific Gravity 12/28/2013 1.018  1.003-1.030 Final  . Blood 12/28/2013 Negative  NEGATIVE Final  . Ph 12/28/2013 5.0  4.5-8.0 Final  . Protein 12/28/2013 30 mg/dL  NEGATIVE Final  . Nitrite 12/28/2013 Negative  NEGATIVE Final  . Leukocyte Esterase 12/28/2013 Negative  NEGATIVE Final  . RBC,UR 12/28/2013 4 /HPF  0-5 /HPF Final  . WBC UR 12/28/2013 2 /HPF  0-5 /HPF Final  . Bacteria 12/28/2013 TRACE  NONE SEEN Final  . Squamous Epithelial 12/28/2013 NONE SEEN   Final  . Mucous 12/28/2013 PRESENT   Final  . Micro Text Report 12/28/2013    Final                   Value:   COMMENT                   NO GROWTH AEROBICALLY/ANAEROBICALLY IN 5 DAYS   ANTIBIOTIC                                                      . Micro Text Report 12/28/2013    Final                   Value:   COMMENT                   NO GROWTH AEROBICALLY/ANAEROBICALLY IN 5 DAYS   ANTIBIOTIC                                                      . Lipase  12/29/2013 4750* 73-393 Unit/L Final  . SGOT(AST) 12/29/2013 150* 15-37 Unit/L Final  . SGPT (ALT) 12/29/2013 216*  Final   Comment: 14-63 NOTE: New Reference Range 07/29/13   . Alkaline Phosphatase 12/29/2013 290*  Final   Comment: 46-116 NOTE: New Reference Range 07/29/13   . Albumin 12/29/2013 2.8* 3.4-5.0 g/dL Final  . Total Protein 12/29/2013 6.9  6.4-8.2 g/dL Final  . Bilirubin,Total 12/29/2013 6.5* 0.2-1.0 mg/dL Final  . Bilirubin, Direct 12/29/2013 4.8* 0.0-0.2 mg/dL Final  . WBC 12/29/2013 14.4* 3.8-10.6 x10 3/mm 3 Final  . RBC 12/29/2013 3.89* 4.40-5.90 x10 6/mm 3 Final  . HGB 12/29/2013 11.1* 13.0-18.0 g/dL Final  . HCT 12/29/2013 32.4* 40.0-52.0 % Final  . MCV 12/29/2013 83  80-100 fL Final  . MCH 12/29/2013 28.6  26.0-34.0 pg Final  . MCHC 12/29/2013 34.3  32.0-36.0 g/dL Final  . RDW 12/29/2013 19.5* 11.5-14.5 % Final  . Platelet 12/29/2013 598* 150-440 x10 3/mm 3 Final  . Neutrophil % 12/29/2013 83.9   Final  . Lymphocyte % 12/29/2013 9.7   Final  . Monocyte % 12/29/2013 5.2   Final  . Eosinophil % 12/29/2013 0.4   Final  . Basophil % 12/29/2013 0.8   Final  . Neutrophil # 12/29/2013 12.1* 1.4-6.5 x10 3/mm 3 Final  . Lymphocyte # 12/29/2013 1.4  1.0-3.6 x10 3/mm 3 Final  . Monocyte # 12/29/2013 0.8  0.2-1.0 x10 3/mm  Final  . Eosinophil # 12/29/2013 0.1  0.0-0.7 x10 3/mm 3 Final  . Basophil # 12/29/2013 0.1  0.0-0.1 x10  3/mm 3 Final  . Lipase 12/30/2013 3313* 73-393 Unit/L Final  . Glucose 12/30/2013 86  65-99 mg/dL Final  . BUN 12/30/2013 14  7-18 mg/dL Final  . Creatinine 12/30/2013 0.84  0.60-1.30 mg/dL Final  . Sodium 12/30/2013 139  136-145 mmol/L Final  . Potassium 12/30/2013 3.3* 3.5-5.1 mmol/L Final  . Chloride 12/30/2013 105  98-107 mmol/L Final  . Co2 12/30/2013 25  21-32 mmol/L Final  . Calcium, Total 12/30/2013 8.6  8.5-10.1 mg/dL Final  . SGOT(AST) 12/30/2013 120* 15-37 Unit/L Final  . SGPT (ALT) 12/30/2013 185*  Final   Comment: 14-63 NOTE:  New Reference Range 07/29/13   . Alkaline Phosphatase 12/30/2013 324*  Final   Comment: 46-116 NOTE: New Reference Range 07/29/13   . Albumin 12/30/2013 2.9* 3.4-5.0 g/dL Final  . Total Protein 12/30/2013 6.8  6.4-8.2 g/dL Final  . Bilirubin,Total 12/30/2013 7.7* 0.2-1.0 mg/dL Final  . Osmolality 12/30/2013 277  275-301 Final  . Anion Gap 12/30/2013 9  7-16 Final  . EGFR (African American) 12/30/2013 >60  >31m/min Final  . EGFR (Non-African Amer.) 12/30/2013 >60  >631mmin Final   Comment: eGFR values <6070min/1.73 m2 may be an indication of chronic kidney disease (CKD). Calculated eGFR, using the MRDR Study equation, is useful in  patients with stable renal function. The eGFR calculation will not be reliable in acutely ill patients when serum creatinine is changing rapidly. It is not useful in patients on dialysis. The eGFR calculation may not be applicable to patients at the low and high extremes of body sizes, pregnant women, and vegetarians.   . WBC 12/30/2013 16.3* 3.8-10.6 x10 3/mm 3 Final  . RBC 12/30/2013 3.75* 4.40-5.90 x10 6/mm 3 Final  . HGB 12/30/2013 10.7* 13.0-18.0 g/dL Final  . HCT 12/30/2013 31.6* 40.0-52.0 % Final  . MCV 12/30/2013 84  80-100 fL Final  . MCH 12/30/2013 28.6  26.0-34.0 pg Final  . MCHC 12/30/2013 34.0  32.0-36.0 g/dL Final  . RDW 12/30/2013 19.5* 11.5-14.5 % Final  . Platelet 12/30/2013 579* 150-440 x10 3/mm 3 Final  . Neutrophil % 12/30/2013 85.8   Final  . Lymphocyte % 12/30/2013 7.5   Final  . Monocyte % 12/30/2013 5.7   Final  . Eosinophil % 12/30/2013 0.0   Final  . Basophil % 12/30/2013 1.0   Final  . Neutrophil # 12/30/2013 14.0* 1.4-6.5 x10 3/mm 3 Final  . Lymphocyte # 12/30/2013 1.2  1.0-3.6 x10 3/mm 3 Final  . Monocyte # 12/30/2013 0.9  0.2-1.0 x10 3/mm  Final  . Eosinophil # 12/30/2013 0.0  0.0-0.7 x10 3/mm 3 Final  . Basophil # 12/30/2013 0.2* 0.0-0.1 x10 3/mm 3 Final  . Glucose 12/31/2013 67  65-99 mg/dL Final  . BUN  12/31/2013 10  7-18 mg/dL Final  . Creatinine 12/31/2013 0.84  0.60-1.30 mg/dL Final  . Sodium 12/31/2013 139  136-145 mmol/L Final  . Potassium 12/31/2013 3.5  3.5-5.1 mmol/L Final  . Chloride 12/31/2013 107  98-107 mmol/L Final  . Co2 12/31/2013 24  21-32 mmol/L Final  . Calcium, Total 12/31/2013 7.9* 8.5-10.1 mg/dL Final  . SGOT(AST) 12/31/2013 71* 15-37 Unit/L Final  . SGPT (ALT) 12/31/2013 129*  Final   Comment: 14-63 NOTE: New Reference Range 07/29/13   . Alkaline Phosphatase 12/31/2013 237*  Final   Comment: 46-116 NOTE: New Reference Range 07/29/13   . Albumin 12/31/2013 2.7* 3.4-5.0 g/dL Final  . Total Protein 12/31/2013 6.3* 6.4-8.2 g/dL Final  . Bilirubin,Total 12/31/2013 2.7* 0.2-1.0 mg/dL Final  . Osmolality  12/31/2013 275  275-301 Final  . Anion Gap 12/31/2013 8  7-16 Final  . EGFR (African American) 12/31/2013 >60  >78m/min Final  . EGFR (Non-African Amer.) 12/31/2013 >60  >653mmin Final   Comment: eGFR values <6077min/1.73 m2 may be an indication of chronic kidney disease (CKD). Calculated eGFR, using the MRDR Study equation, is useful in  patients with stable renal function. The eGFR calculation will not be reliable in acutely ill patients when serum creatinine is changing rapidly. It is not useful in patients on dialysis. The eGFR calculation may not be applicable to patients at the low and high extremes of body sizes, pregnant women, and vegetarians.   . Lipase 12/31/2013 157  73-393 Unit/L Final  . WBC 12/31/2013 12.1* 3.8-10.6 x10 3/mm 3 Final  . RBC 12/31/2013 3.60* 4.40-5.90 x10 6/mm 3 Final  . HGB 12/31/2013 10.3* 13.0-18.0 g/dL Final  . HCT 12/31/2013 31.2* 40.0-52.0 % Final  . MCV 12/31/2013 87  80-100 fL Final  . MCH 12/31/2013 28.6  26.0-34.0 pg Final  . MCHC 12/31/2013 33.1  32.0-36.0 g/dL Final  . RDW 12/31/2013 20.0* 11.5-14.5 % Final  . Platelet 12/31/2013 574* 150-440 x10 3/mm 3 Final   Comment: Blood smear from 12/31/13 and previous  history reviewed. 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 The patient is post-splenectomy, and there are Howell-Jolly bodies; the increased platelet count is also likely due to the splenectomy in September 2015. Hematology consultation is recommended. M OBlain PaisD. NRBCS - PATHOLOGIST TO REVIEW SMEAR. COMMENTS  - APPEAR ON REPORT WHEN COMPLETE.   . Bands 12/31/2013 1   Final  . Segmented Neutrophils 12/31/2013 79   Final  . Lymphocytes 12/31/2013 14   Final  . Monocytes 12/31/2013 3   Final  . Eosinophil 12/31/2013 2   Final  . Basophil 12/31/2013 1   Final  . NRBC/100 WBC 12/31/2013 4   Final  . Comment - H1-Com1 12/31/2013 ANISOCYTOSIS   Final  . Comment - H1-Com2 12/31/2013 POLYCHROMASIA   Final  . Comment - H1-Com3 12/31/2013 MICROCYTES PRESENT   Final  . Comment - H1-Com4 12/31/2013 PLTS VARIED IN SIZE   Final  . SURGICAL PATHOLOGY 01/01/2014    Final                   Value:Surgical Procedure CASE: [id number] PATIENT: LEOJeni Sallesrgical Pathology Report     SPECIMEN SUBMITTED: A. Gallbladder  CLINICAL HISTORY: None provided  PRE-OPERATIVE DIAGNOSIS: choledocholithiasis  POST-OPERATIVE DIAGNOSIS: same/lap chole.     DIAGNOSIS: A. GALLBLADDER; CHOLECYSTECTOMY: - CHOLELITHIASIS, CHRONIC CHOLECYSTITIS, AND CHOLESTEROLOSIS.   GROSS DESCRIPTION: A. Measurements: 7.6 x 3.5 x 2.2 cm Previously opened: No External surface: Pink-tan with fibrous adhesions Wall thickness: 0.5 cm Mucosa: Red and diffusely stippled yellow Stones present: Yes, aggregate 3.5 x 2.5 x 0.3 cm, granular black Other findings: Cystic duct margin inked green  Block summary: 1-representative perpendicular cystic duct margin 2-representative sections of the wall  Final Diagnosis performed by MarBryan LemmaD.  Electronically signed 01/05/2014 1:36:12PM    The electronic signature indicates that the  named Attending Pathologist has evaluated the spe                         cimen  Technical component performed at [facility]urShe[facility]onC [id number]: [id number]: WilDarrick PennaanEvette DoffingD  Professional component performed at [facility]  [place], [place], [place] [address] Lab: [phone] Dir: Dellia Nims. Rubinas, MD      Assessment:  Anthony Castaneda is a 64 y.o. male with a history of a Coombs positive hemolytic anemia diagnosed in 11/2012. He responded well to prednisone, but with tapering doses develop recurrent hemolysis.  Chest, abdomen and pelvic CT scan revealed no abnormality. Bone marrow was unrevealing.  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   Symptomatically, he is doing well. Exam reveals no hepatomegaly or adenopathy. CBC is normal.  Alkaline phosphatase remains elevated, but improving.  Plan: 1.  Review labs from 12/18/2014. 2.  Slip for fractionated alkaline phosphatase. 3.  RTC prn.  Lequita Asal, MD  12/25/2014, 3:14 PM

## 2015-01-05 ENCOUNTER — Telehealth: Payer: Self-pay | Admitting: *Deleted

## 2015-01-05 NOTE — Telephone Encounter (Signed)
His PCP is Elyse Jarvis

## 2015-01-13 ENCOUNTER — Encounter: Payer: Self-pay | Admitting: Hematology and Oncology

## 2015-01-29 ENCOUNTER — Telehealth: Payer: Self-pay | Admitting: *Deleted

## 2015-01-29 NOTE — Telephone Encounter (Signed)
States he is still waiting to hear from [place] regarding labs drawn at [place] 2 weeks ago

## 2015-01-29 NOTE — Telephone Encounter (Signed)
  Can you find these [facility] labs?  Thanks,  M

## 2015-02-03 ENCOUNTER — Telehealth: Payer: Self-pay

## 2015-02-03 NOTE — Telephone Encounter (Signed)
Called pt per MD to discuss Lab results.  Per MD everything is ok and nothing else needed at this time.  Pt verbalized an understanding no other concerns noted.

## 2015-02-18 ENCOUNTER — Encounter: Payer: Self-pay | Admitting: Hematology and Oncology

## 2015-09-24 ENCOUNTER — Encounter: Payer: Self-pay | Admitting: *Deleted

## 2015-09-27 ENCOUNTER — Encounter
Admission: RE | Disposition: A | Discharge status: Home / Self Care | Payer: Self-pay | Source: Ambulatory Visit | Attending: Unknown Physician Specialty

## 2015-09-27 ENCOUNTER — Ambulatory Visit: Payer: 59 | Admitting: Anesthesiology

## 2015-09-27 ENCOUNTER — Ambulatory Visit
Admission: RE | Admit: 2015-09-27 | Discharge: 2015-09-27 | Disposition: A | Discharge status: Home / Self Care | Payer: 59 | Source: Ambulatory Visit | Attending: Unknown Physician Specialty | Admitting: Unknown Physician Specialty

## 2015-09-27 DIAGNOSIS — Z87891 Personal history of nicotine dependence: Secondary | ICD-10-CM | POA: Insufficient documentation

## 2015-09-27 DIAGNOSIS — Z1211 Encounter for screening for malignant neoplasm of colon: Secondary | ICD-10-CM | POA: Insufficient documentation

## 2015-09-27 DIAGNOSIS — K635 Polyp of colon: Secondary | ICD-10-CM | POA: Diagnosis not present

## 2015-09-27 DIAGNOSIS — K640 First degree hemorrhoids: Secondary | ICD-10-CM | POA: Diagnosis not present

## 2015-09-27 DIAGNOSIS — K5730 Diverticulosis of large intestine without perforation or abscess without bleeding: Secondary | ICD-10-CM | POA: Insufficient documentation

## 2015-09-27 DIAGNOSIS — K621 Rectal polyp: Secondary | ICD-10-CM | POA: Insufficient documentation

## 2015-09-27 HISTORY — PX: COLONOSCOPY WITH PROPOFOL: SHX5780

## 2015-09-27 SURGERY — COLONOSCOPY WITH PROPOFOL
Anesthesia: General

## 2015-09-27 MED ORDER — SODIUM CHLORIDE 0.9 % IV SOLN: INTRAVENOUS | Status: DC

## 2015-09-27 MED ORDER — SODIUM CHLORIDE 0.9 % IV SOLN
INTRAVENOUS | Status: DC
  Administered 2015-09-27 (×2): via INTRAVENOUS

## 2015-09-27 MED ORDER — PROPOFOL 10 MG/ML IV BOLUS
INTRAVENOUS | Status: DC | PRN
  Administered 2015-09-27: 70 mg via INTRAVENOUS

## 2015-09-27 MED ORDER — PROPOFOL 500 MG/50ML IV EMUL
INTRAVENOUS | Status: DC | PRN
  Administered 2015-09-27: 120 ug/kg/min via INTRAVENOUS

## 2015-09-27 NOTE — Op Note (Signed)
[facility] Patient Name: Anthony Castaneda Procedure Date: 09/27/2015 2:48 PM MRN: [id number] Account #: [id number] Date of Birth: 05-24-1950 Admit Type: Outpatient Age: 65 Room: [facility] ENDO ROOM 4 Gender: Male Note Status: Finalized Procedure:            Colonoscopy Indications:          Screening for colorectal malignant neoplasm Providers:            Manya Silvas, MD Referring MD:         Dyke Maes. Mancheno Revelo (Referring MD) Medicines:            Propofol per Anesthesia Complications:        No immediate complications. Procedure:            Pre-Anesthesia Assessment:                       - After reviewing the risks and benefits, the patient                        was deemed in satisfactory condition to undergo the                        procedure.                       After obtaining informed consent, the colonoscope was                        passed under direct vision. Throughout the procedure,                        the patient's blood pressure, pulse, and oxygen                        saturations were monitored continuously. The                        Colonoscope was introduced through the anus and                        advanced to the the cecum, identified by appendiceal                        orifice and ileocecal valve. The colonoscopy was                        performed without difficulty. The patient tolerated the                        procedure well. The quality of the bowel preparation                        was good. Findings:      A diminutive polyp was found in the sigmoid colon. The polyp was       sessile. The polyp was removed with a hot snare. Resection and retrieval       were complete.      A diminutive polyp was found in the rectum. The polyp was sessile. The       polyp was removed with a jumbo cold forceps. Resection and retrieval  were complete.      A few small-mouthed diverticula were found in the sigmoid  colon.      Internal hemorrhoids were found during endoscopy. The hemorrhoids were       small and Grade I (internal hemorrhoids that do not prolapse).      The exam was otherwise without abnormality. Impression:           - One diminutive polyp in the sigmoid colon, removed                        with a hot snare. Resected and retrieved.                       - One diminutive polyp in the rectum, removed with a                        jumbo cold forceps. Resected and retrieved.                       - Diverticulosis in the sigmoid colon.                       - Internal hemorrhoids.                       - The examination was otherwise normal. Recommendation:       - Await pathology results. Manya Silvas, MD 09/27/2015 3:33:56 PM This report has been signed electronically. Number of Addenda: 0 Note Initiated On: 09/27/2015 2:48 PM Scope Withdrawal Time: 0 hours 27 minutes 34 seconds  Total Procedure Duration: 0 hours 33 minutes 3 seconds       [facility]

## 2015-09-27 NOTE — Anesthesia Preprocedure Evaluation (Signed)
Anesthesia Evaluation  Patient identified by MRN, date of birth, ID band Patient awake    Reviewed: Allergy & Precautions, NPO status , Patient's Chart, lab work & pertinent test results  History of Anesthesia Complications Negative for: history of anesthetic complications  Airway Mallampati: II       Dental   Pulmonary neg pulmonary ROS, former smoker,           Cardiovascular negative cardio ROS       Neuro/Psych negative neurological ROS     GI/Hepatic negative GI ROS, Neg liver ROS,   Endo/Other  negative endocrine ROS  Renal/GU Renal disease (stones)     Musculoskeletal   Abdominal   Peds  Hematology  (+) anemia ,   Anesthesia Other Findings   Reproductive/Obstetrics                             Anesthesia Physical Anesthesia Plan  ASA: II  Anesthesia Plan: General   Post-op Pain Management:    Induction: Intravenous  Airway Management Planned: Nasal Cannula  Additional Equipment:   Intra-op Plan:   Post-operative Plan:   Informed Consent: I have reviewed the patients History and Physical, chart, labs and discussed the procedure including the risks, benefits and alternatives for the proposed anesthesia with the patient or authorized representative who has indicated his/her understanding and acceptance.     Plan Discussed with:   Anesthesia Plan Comments:         Anesthesia Quick Evaluation

## 2015-09-27 NOTE — H&P (Signed)
   Primary Care Physician:  Elyse Jarvis, MD Primary Gastroenterologist:  Dr. Vira Agar  Pre-Procedure History & Physical: HPI:  Anthony Castaneda is a 65 y.o. male is here for an colonoscopy.   Past Medical History:  Diagnosis Date  . Hemolytic anemia ([place])   . Nephrolithiasis     Past Surgical History:  Procedure Laterality Date  . APPENDECTOMY    . CHOLECYSTECTOMY    . SPLENECTOMY      Prior to Admission medications   Not on File    Allergies as of 09/16/2015  . (Not on File)    History reviewed. No pertinent family history.  Social History   Social History  . Marital status: Married    Spouse name: N/A  . Number of children: N/A  . Years of education: N/A   Occupational History  . Not on file.   Social History Main Topics  . Smoking status: Former Research scientist (life sciences)  . Smokeless tobacco: Never Used     Comment: At the age of 22; he smoked for 2 years and then quit...  . Alcohol use No  . Drug use: No  . Sexual activity: Not on file   Other Topics Concern  . Not on file   Social History Narrative  . No narrative on file    Review of Systems: See HPI, otherwise negative ROS  Physical Exam: BP (!) 160/94   Pulse 93   Temp 97.9 F (36.6 C) (Oral)   Resp 16   Ht 5\' 5"  (1.651 m)   Wt 74.8 kg (165 lb)   SpO2 97%   BMI 27.46 kg/m  General:   Alert,  pleasant and cooperative in NAD Head:  Normocephalic and atraumatic. Neck:  Supple; no masses or thyromegaly. Lungs:  Clear throughout to auscultation.    Heart:  Regular rate and rhythm. Abdomen:  Soft, nontender and nondistended. Normal bowel sounds, without guarding, and without rebound.   Neurologic:  Alert and  oriented x4;  grossly normal neurologically.  Impression/Plan: JERROL EMARD is here for an colonoscopy to be performed for screening  Risks, benefits, limitations, and alternatives regarding  colonoscopy have been reviewed with the patient.  Questions have been answered.  All parties  agreeable.   Gaylyn Cheers, MD  09/27/2015, 2:48 PM

## 2015-09-27 NOTE — Anesthesia Postprocedure Evaluation (Signed)
Anesthesia Post Note  Patient: Anthony Castaneda  Procedure(s) Performed: Procedure(s) (LRB): COLONOSCOPY WITH PROPOFOL (N/A)  Patient location during evaluation: Endoscopy Anesthesia Type: General Level of consciousness: awake and alert Pain management: pain level controlled Vital Signs Assessment: post-procedure vital signs reviewed and stable Respiratory status: spontaneous breathing, nonlabored ventilation, respiratory function stable and patient connected to nasal cannula oxygen Cardiovascular status: blood pressure returned to baseline and stable Postop Assessment: no signs of nausea or vomiting Anesthetic complications: no    Last Vitals:  Vitals:   09/27/15 1555 09/27/15 1605  BP: 120/70 (!) 144/80  Pulse:    Resp:    Temp:      Last Pain:  Vitals:   09/27/15 1535  TempSrc: Tympanic                 Precious Haws Piscitello

## 2015-09-27 NOTE — Transfer of Care (Signed)
Immediate Anesthesia Transfer of Care Note  Patient: Jean Rosenthal  Procedure(s) Performed: Procedure(s): COLONOSCOPY WITH PROPOFOL (N/A)  Patient Location: PACU and Endoscopy Unit  Anesthesia Type:General  Level of Consciousness: patient cooperative and lethargic  Airway & Oxygen Therapy: Patient Spontanous Breathing and Patient connected to nasal cannula oxygen  Post-op Assessment: Report given to RN and Post -op Vital signs reviewed and stable  Post vital signs: Reviewed and stable  Last Vitals:  Vitals:   09/27/15 1535 09/27/15 1538  BP:  97/65  Pulse:    Resp:  16  Temp: (!) (P) 35.7 C (!) 35.8 C    Last Pain:  Vitals:   09/27/15 1535  TempSrc: (P) Tympanic         Complications: No apparent anesthesia complications

## 2015-09-28 ENCOUNTER — Encounter: Payer: Self-pay | Admitting: Unknown Physician Specialty

## 2015-09-29 LAB — SURGICAL PATHOLOGY

## 2016-03-09 IMAGING — CT CT CHEST-ABD-PELV W/O CM
1 of 2 series · 14 of 32 positions shown, 19 images · non-contrast
Comparison: CT 05/21/2011.

ADDENDUM:
Spleen is enlarged to 15 cm in craniocaudad dimension with a
calculated volume of 561 cubic cm. This is increased from calculated
volume of 337 cubic cm on 05/21/2011 with a craniocaudad dimension
of 11.1 cm.

Findings conveyed Quirijn Amazigh on 08/18/2013  at[DATE].
CLINICAL DATA: Jaundice and anemia .
EXAM:
CT CHEST, ABDOMEN AND PELVIS WITHOUT CONTRAST
TECHNIQUE: Multidetector CT imaging of the chest, abdomen and pelvis was
performed following the standard protocol without IV contrast.

[Series 2: cap without · axial · non-contrast · 0.74mm/px · z∈[-654,-78]mm · 14 of 129 slices shown, 19 images]
[im 7/129  soft-tissue]
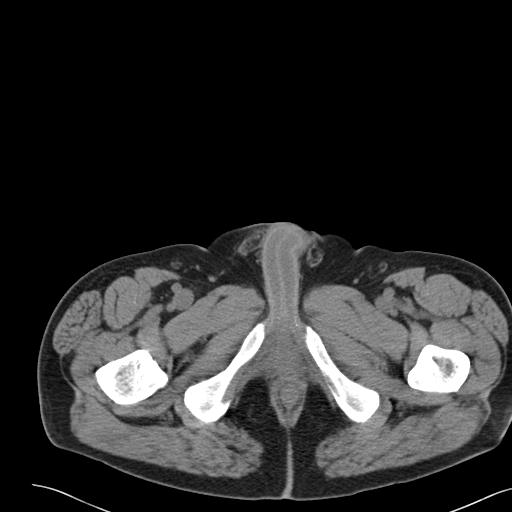
[im 7/129  bone]
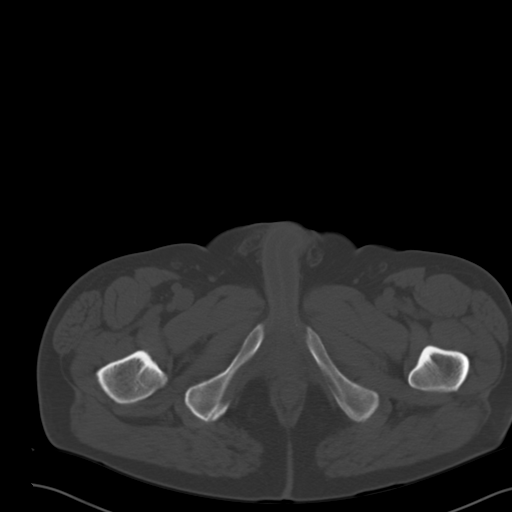
[im 19/129  soft-tissue]
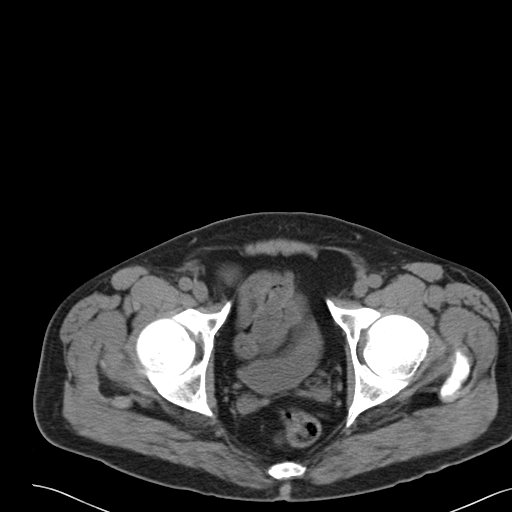
[im 25/129  soft-tissue]
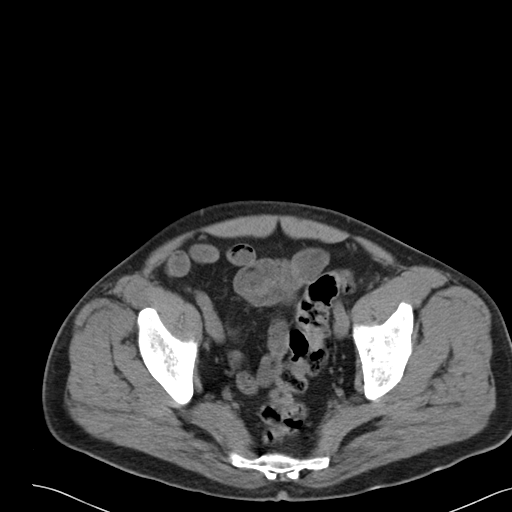
[im 37/129  soft-tissue]
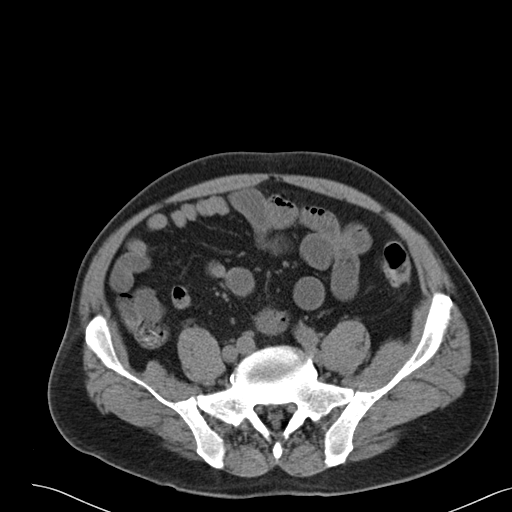
[im 43/129  soft-tissue]
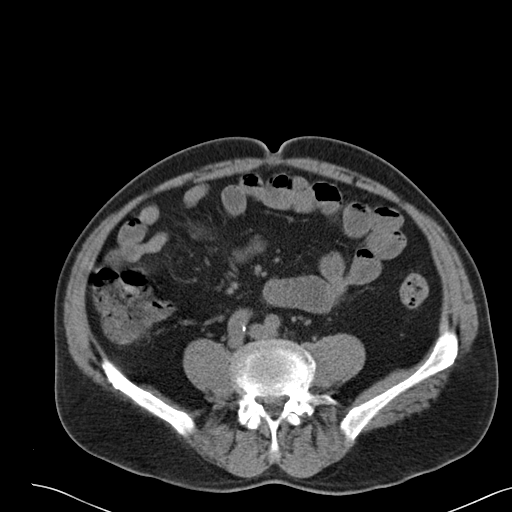
[im 55/129  soft-tissue]
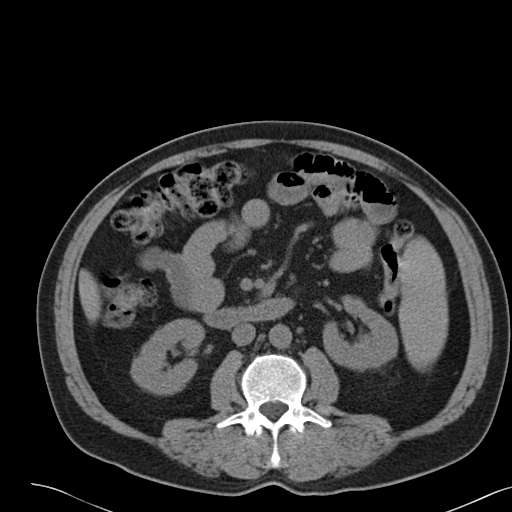
[im 68/129  soft-tissue]
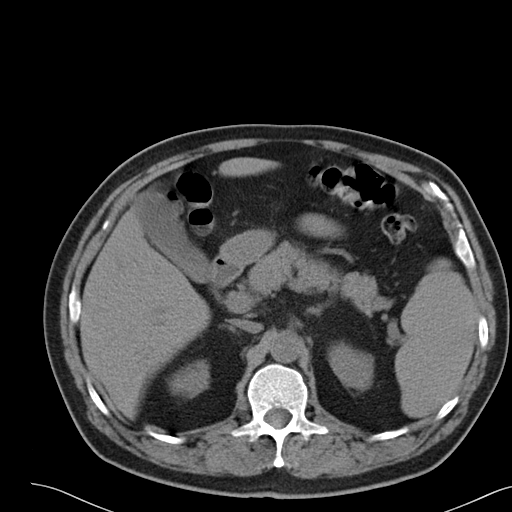
[im 74/129  soft-tissue]
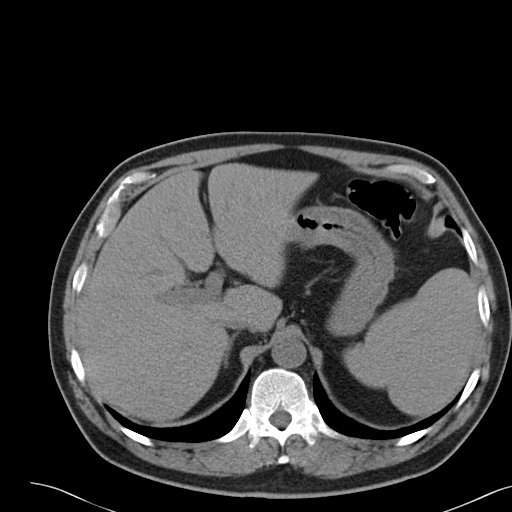
[im 86/129  soft-tissue]
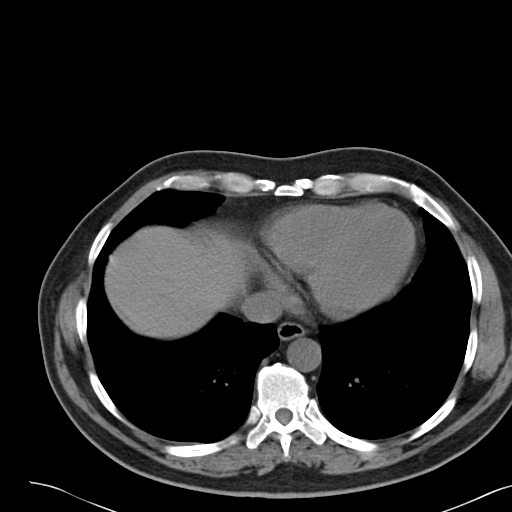
[im 86/129  bone]
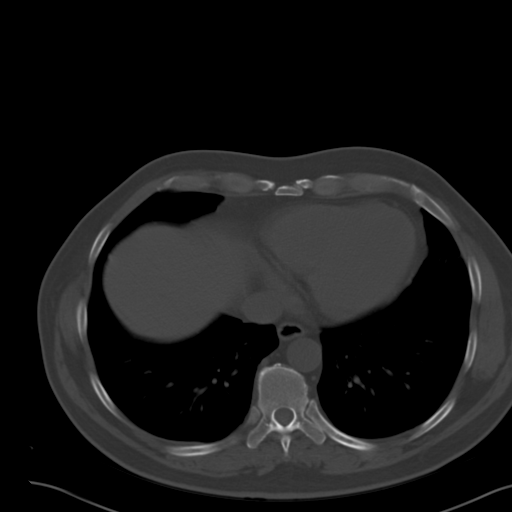
[im 92/129  soft-tissue]
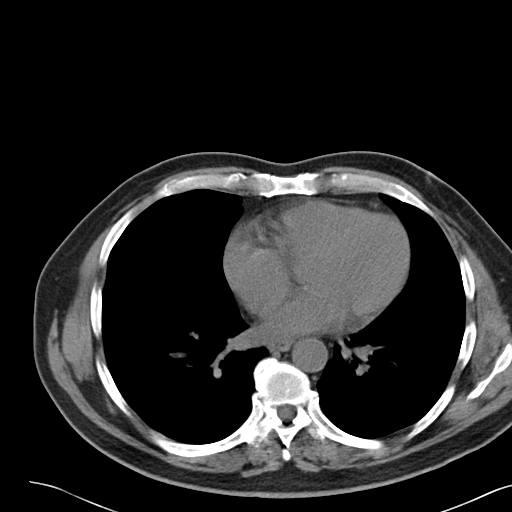
[im 104/129  soft-tissue]
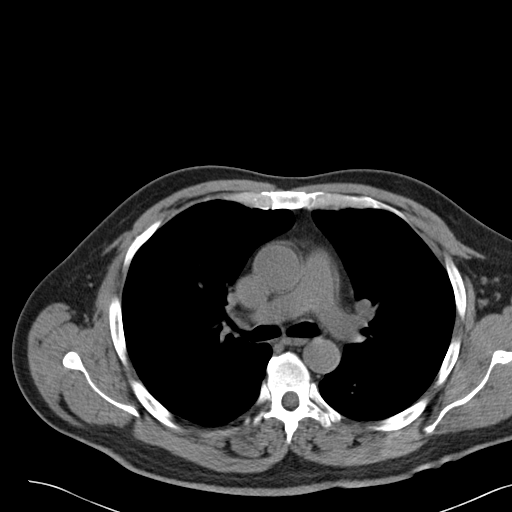
[im 104/129  lung]
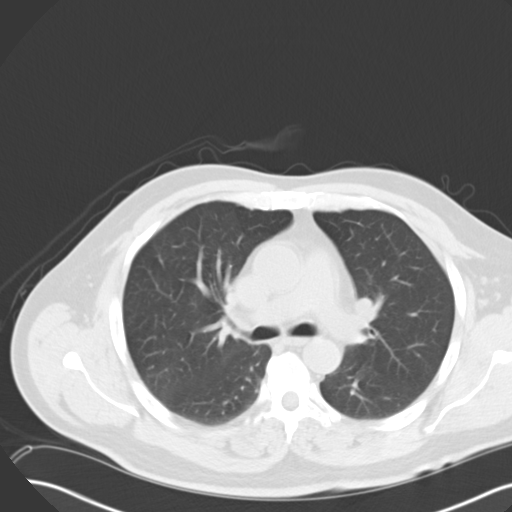
[im 110/129  soft-tissue]
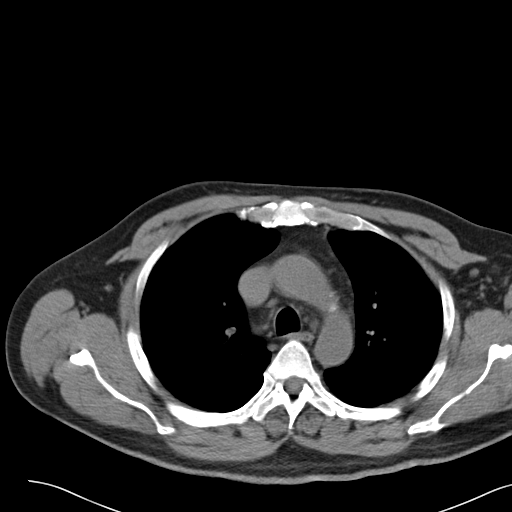
[im 110/129  lung]
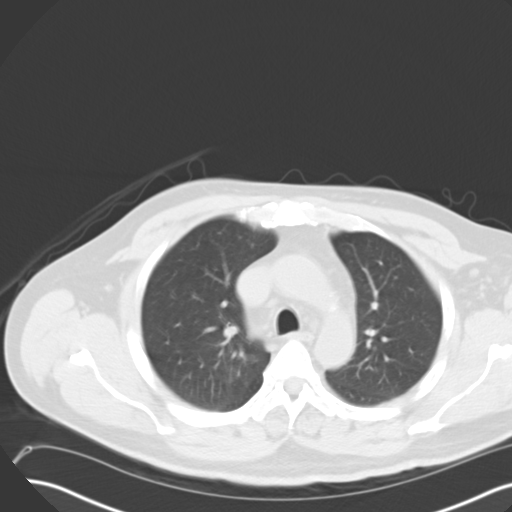
[im 116/129  lung]
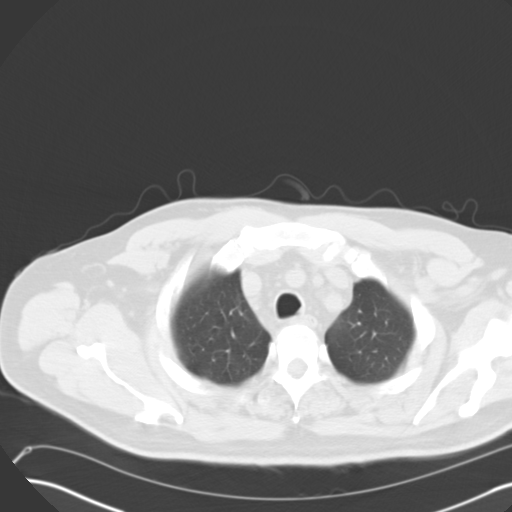
[im 122/129  soft-tissue]
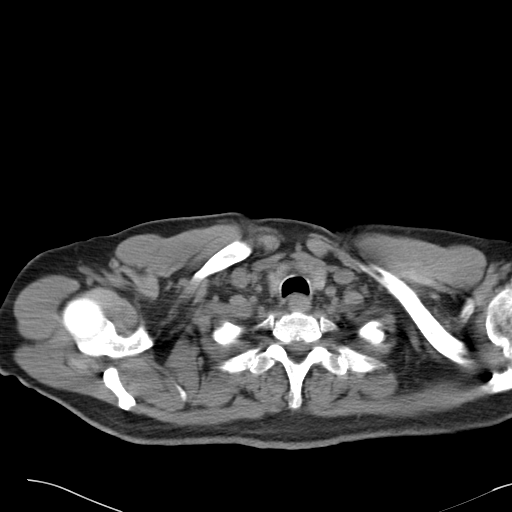
[im 122/129  lung]
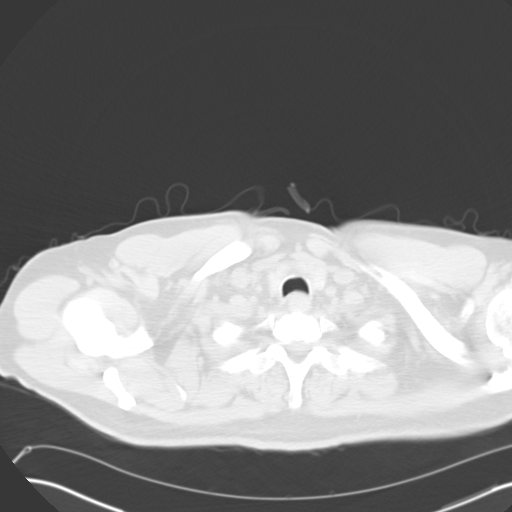

[14 of 32 positions shown; findings below may reference images not displayed]

FINDINGS: CT CHEST FINDINGS

Thoracic aorta is normal in caliber. Heart size normal. Coronary
artery disease.

Shotty mediastinal lymph nodes.  Thoracic esophagus is normal.

Large airways are patent. Mild interstitial prominence noted in the
lungs, these changes most likely related to chronic interstitial
lung disease. No pleural effusion or pneumothorax.

Degenerative changes thoracic spine. No acute bony abnormalities
identified.

CT ABDOMEN AND PELVIS FINDINGS

Liver is normal. Splenomegaly with calcified splenic granulomas.
Pancreas is normal. No biliary distention. Gallbladder nondistended.

Adrenals are normal. Bilateral nonobstructive nephrolithiasis. No
evidence of urolithiasis. The bladder is nondistended. Prostate is
normal.

No significant adenopathy.  Abdominal aorta normal in caliber.

Appendix normal. Minimal prominence of small bowel loops. No
evidence of bowel obstruction or free air. No evidence of mesenteric
mass. No hernia. No acute bony abnormality.

Degenerative changes lumbar spine .
IMPRESSION: 1. Coronary artery disease.
2. Mild interstitial lung disease.
3. Splenomegaly.
4. Pancreas normal. No evidence of biliary distention. The
gallbladder appears unremarkable by CT.

## 2016-05-30 ENCOUNTER — Emergency Department
Admission: EM | Admit: 2016-05-30 | Discharge: 2016-05-30 | Disposition: A | Discharge status: Home / Self Care | Payer: Commercial Managed Care - HMO | Attending: Emergency Medicine | Admitting: Emergency Medicine

## 2016-05-30 ENCOUNTER — Encounter: Payer: Self-pay | Admitting: Emergency Medicine

## 2016-05-30 ENCOUNTER — Emergency Department: Payer: Commercial Managed Care - HMO

## 2016-05-30 DIAGNOSIS — Z87891 Personal history of nicotine dependence: Secondary | ICD-10-CM | POA: Insufficient documentation

## 2016-05-30 DIAGNOSIS — R1032 Left lower quadrant pain: Secondary | ICD-10-CM | POA: Diagnosis present

## 2016-05-30 DIAGNOSIS — N23 Unspecified renal colic: Secondary | ICD-10-CM | POA: Diagnosis not present

## 2016-05-30 LAB — CBC
HEMATOCRIT: 47.9 % (ref 40.0–52.0)
Hemoglobin: 16.3 g/dL (ref 13.0–18.0)
MCH: 30.2 pg (ref 26.0–34.0)
MCHC: 34 g/dL (ref 32.0–36.0)
MCV: 88.8 fL (ref 80.0–100.0)
PLATELETS: 336 10*3/uL (ref 150–440)
RBC: 5.39 MIL/uL (ref 4.40–5.90)
RDW: 15.1 % — AB (ref 11.5–14.5)
WBC: 10.2 10*3/uL (ref 3.8–10.6)

## 2016-05-30 LAB — URINALYSIS, COMPLETE (UACMP) WITH MICROSCOPIC
BILIRUBIN URINE: NEGATIVE
Bacteria, UA: NONE SEEN
GLUCOSE, UA: NEGATIVE mg/dL
Ketones, ur: NEGATIVE mg/dL
Leukocytes, UA: NEGATIVE
Nitrite: NEGATIVE
PH: 5 (ref 5.0–8.0)
Protein, ur: NEGATIVE mg/dL
SPECIFIC GRAVITY, URINE: 1.018 (ref 1.005–1.030)

## 2016-05-30 LAB — BASIC METABOLIC PANEL
Anion gap: 7 (ref 5–15)
BUN: 17 mg/dL (ref 6–20)
CHLORIDE: 108 mmol/L (ref 101–111)
CO2: 26 mmol/L (ref 22–32)
CREATININE: 1.05 mg/dL (ref 0.61–1.24)
Calcium: 9.2 mg/dL (ref 8.9–10.3)
GFR calc Af Amer: >60 mL/min (ref >60)
GFR calc non Af Amer: >60 mL/min (ref >60)
GLUCOSE: 113 mg/dL — AB (ref 65–99)
POTASSIUM: 4.1 mmol/L (ref 3.5–5.1)
SODIUM: 141 mmol/L (ref 135–145)

## 2016-05-30 MED ORDER — MORPHINE SULFATE (PF) 4 MG/ML IV SOLN
4.0000 mg | Freq: Once | INTRAVENOUS | Status: AC
  Administered 2016-05-30: 4 mg via INTRAVENOUS
  Filled 2016-05-30: qty 1

## 2016-05-30 MED ORDER — ONDANSETRON HCL 4 MG/2ML IJ SOLN
4.0000 mg | Freq: Once | INTRAMUSCULAR | Status: AC
  Administered 2016-05-30: 4 mg via INTRAVENOUS
  Filled 2016-05-30: qty 2

## 2016-05-30 MED ORDER — TAMSULOSIN HCL 0.4 MG PO CAPS: 0.4000 mg | ORAL_CAPSULE | Freq: Every day | ORAL | 0 refills | Status: DC

## 2016-05-30 MED ORDER — KETOROLAC TROMETHAMINE 30 MG/ML IJ SOLN
30.0000 mg | Freq: Once | INTRAMUSCULAR | Status: AC
  Administered 2016-05-30: 30 mg via INTRAVENOUS
  Filled 2016-05-30: qty 1

## 2016-05-30 MED ORDER — ONDANSETRON 4 MG PO TBDP: 4.0000 mg | ORAL_TABLET | Freq: Three times a day (TID) | ORAL | 0 refills | Status: DC | PRN

## 2016-05-30 MED ORDER — OXYCODONE-ACETAMINOPHEN 5-325 MG PO TABS: 1.0000 | ORAL_TABLET | Freq: Four times a day (QID) | ORAL | 0 refills | Status: DC | PRN

## 2016-05-30 NOTE — ED Triage Notes (Signed)
Per interpreter pt presents with LLQ pain radiating around into lower back. Pt with hx of kidney stones

## 2016-05-30 NOTE — ED Notes (Signed)
Pt reports left sided pain that radiates into lower back - he reports blood in urine and pain with urination - pt also reports that 6 months ago he had the same pain around 6am and at noon he "peed something out" and the pain stopped - Dr Jimmye Norman has already assessed pt and informed him that he has a kidney stone - discussed kidney stones with pt at pt request

## 2016-05-30 NOTE — ED Provider Notes (Signed)
[facility] Emergency Department Provider Note       Time seen: ----------------------------------------- 11:00 AM on 05/30/2016 -----------------------------------------     I have reviewed the triage vital signs and the nursing notes.   HISTORY   Chief Complaint Flank Pain    HPI Anthony Castaneda is a 66 y.o. male who presents to the ED for left lower quadrant pain radiating around into the lower back. Patient reports history of kidney stones. Pain is currently 10 out of 10 in the left flank. He denies fevers, chills or other complaints. Patient states his been many years since she's had a kidney stone.   Past Medical History:  Diagnosis Date  . Hemolytic anemia ([place])   . Nephrolithiasis     Patient Active Problem List   Diagnosis Date Noted  . Hemolytic anemia ([place]) 06/19/2014    Past Surgical History:  Procedure Laterality Date  . APPENDECTOMY    . CHOLECYSTECTOMY    . COLONOSCOPY WITH PROPOFOL N/A 09/27/2015   Procedure: COLONOSCOPY WITH PROPOFOL;  Surgeon: Manya Silvas, MD;  Location: [facility] ENDOSCOPY;  Service: Endoscopy;  Laterality: N/A;  . SPLENECTOMY      Allergies Patient has no known allergies.  Social History Social History  Substance Use Topics  . Smoking status: Former Research scientist (life sciences)  . Smokeless tobacco: Never Used     Comment: At the age of 89; he smoked for 2 years and then quit...  . Alcohol use No    Review of Systems Constitutional: Negative for fever. Eyes: Negative for vision changes ENT:  Negative for congestion, sore throat Cardiovascular: Negative for chest pain. Respiratory: Negative for shortness of breath. Gastrointestinal: Positive for flank pain Genitourinary: Negative for dysuria. Musculoskeletal: Negative for back pain. Skin: Negative for rash. Neurological: Negative for headaches, focal weakness or numbness.  All systems negative/normal/unremarkable except as stated in the  HPI  ____________________________________________   PHYSICAL EXAM:  VITAL SIGNS: ED Triage Vitals  Enc Vitals Group     BP 05/30/16 0942 (!) 149/84     Pulse Rate 05/30/16 0942 79     Resp 05/30/16 0942 20     Temp 05/30/16 0942 97.6 F (36.4 C)     Temp Source 05/30/16 0942 Oral     SpO2 05/30/16 0942 96 %     Weight 05/30/16 0943 165 lb (74.8 kg)     Height --      Head Circumference --      Peak Flow --      Pain Score 05/30/16 0942 10     Pain Loc --      Pain Edu? --      Excl. in [place]? --     Constitutional: Alert and oriented. Mild distress Eyes: Conjunctivae are normal. Normal extraocular movements. ENT   Head: Normocephalic and atraumatic.   Nose: No congestion/rhinnorhea.   Mouth/Throat: Mucous membranes are moist.   Neck: No stridor. Cardiovascular: Normal rate, regular rhythm. No murmurs, rubs, or gallops. Respiratory: Normal respiratory effort without tachypnea nor retractions. Breath sounds are clear and equal bilaterally. No wheezes/rales/rhonchi. Gastrointestinal: Left flank tenderness, no rebound or guarding. Normal bowel sounds. Musculoskeletal: Nontender with normal range of motion in extremities. No lower extremity tenderness nor edema. Neurologic:  Normal speech and language. No gross focal neurologic deficits are appreciated.  Skin:  Skin is warm, dry and intact. No rash noted. Psychiatric: Mood and affect are normal. Speech and behavior are normal.  ____________________________________________  ED COURSE:  Pertinent labs &  imaging results that were available during my care of the patient were reviewed by me and considered in my medical decision making (see chart for details). Patient presents for flank pain, we will assess with labs and imaging as indicated.   Procedures ____________________________________________   LABS (pertinent positives/negatives)  Labs Reviewed  URINALYSIS, COMPLETE (UACMP) WITH MICROSCOPIC - Abnormal;  Notable for the following:       Result Value   Color, Urine YELLOW (*)    APPearance HAZY (*)    Hgb urine dipstick LARGE (*)    Squamous Epithelial / LPF 0-5 (*)    All other components within normal limits  BASIC METABOLIC PANEL - Abnormal; Notable for the following:    Glucose, Bld 113 (*)    All other components within normal limits  CBC - Abnormal; Notable for the following:    RDW 15.1 (*)    All other components within normal limits    RADIOLOGY Images were viewed by me  KUB IMPRESSION: 1. Probable 4 mm stone at the left UVJ. 2. Bilateral nephrolithiasis measuring 3 mm in the left and 5 mm on the right. ____________________________________________  FINAL ASSESSMENT AND PLAN  Renal colic  Plan: Patient's labs and imaging were dictated above. Patient had presented for Flank pain which is apparently from renal colic. He will likely go to pass the stone, will be placed on Flomax, pain medicine and antiemetics. He is cleared for outpatient follow-up.   Earleen Newport, MD   Note: This note was generated in part or whole with voice recognition software. Voice recognition is usually quite accurate but there are transcription errors that can and very often do occur. I apologize for any typographical errors that were not detected and corrected.     Earleen Newport, MD 05/30/16 [phone]

## 2016-06-24 ENCOUNTER — Emergency Department
Admission: EM | Admit: 2016-06-24 | Discharge: 2016-06-24 | Disposition: A | Discharge status: Home / Self Care | Payer: Commercial Managed Care - HMO | Attending: Emergency Medicine | Admitting: Emergency Medicine

## 2016-06-24 ENCOUNTER — Encounter: Payer: Self-pay | Admitting: *Deleted

## 2016-06-24 ENCOUNTER — Emergency Department: Payer: Commercial Managed Care - HMO

## 2016-06-24 DIAGNOSIS — N200 Calculus of kidney: Secondary | ICD-10-CM | POA: Diagnosis not present

## 2016-06-24 DIAGNOSIS — Z87891 Personal history of nicotine dependence: Secondary | ICD-10-CM | POA: Insufficient documentation

## 2016-06-24 DIAGNOSIS — R1031 Right lower quadrant pain: Secondary | ICD-10-CM | POA: Diagnosis present

## 2016-06-24 LAB — COMPREHENSIVE METABOLIC PANEL
ALK PHOS: 101 U/L (ref 38–126)
ALT: 21 U/L (ref 17–63)
AST: 23 U/L (ref 15–41)
Albumin: 4.3 g/dL (ref 3.5–5.0)
Anion gap: 7 (ref 5–15)
BILIRUBIN TOTAL: 0.7 mg/dL (ref 0.3–1.2)
BUN: 16 mg/dL (ref 6–20)
CO2: 25 mmol/L (ref 22–32)
CREATININE: 1.01 mg/dL (ref 0.61–1.24)
Calcium: 8.7 mg/dL — ABNORMAL LOW (ref 8.9–10.3)
Chloride: 106 mmol/L (ref 101–111)
GFR calc Af Amer: >60 mL/min (ref >60)
GFR calc non Af Amer: >60 mL/min (ref >60)
Glucose, Bld: 110 mg/dL — ABNORMAL HIGH (ref 65–99)
Potassium: 3.5 mmol/L (ref 3.5–5.1)
Sodium: 138 mmol/L (ref 135–145)
TOTAL PROTEIN: 7.5 g/dL (ref 6.5–8.1)

## 2016-06-24 LAB — URINALYSIS, COMPLETE (UACMP) WITH MICROSCOPIC
BILIRUBIN URINE: NEGATIVE
Bacteria, UA: NONE SEEN
Glucose, UA: NEGATIVE mg/dL
Ketones, ur: NEGATIVE mg/dL
Leukocytes, UA: NEGATIVE
Nitrite: NEGATIVE
PH: 6 (ref 5.0–8.0)
Protein, ur: NEGATIVE mg/dL
SPECIFIC GRAVITY, URINE: 1.02 (ref 1.005–1.030)
SQUAMOUS EPITHELIAL / LPF: NONE SEEN

## 2016-06-24 LAB — CBC WITH DIFFERENTIAL/PLATELET
BASOS ABS: 0.2 10*3/uL — AB (ref 0–0.1)
BASOS PCT: 2 %
EOS PCT: 3 %
Eosinophils Absolute: 0.3 10*3/uL (ref 0–0.7)
HCT: 47.9 % (ref 40.0–52.0)
Hemoglobin: 16.2 g/dL (ref 13.0–18.0)
LYMPHS PCT: 29 %
Lymphs Abs: 2.4 10*3/uL (ref 1.0–3.6)
MCH: 30.1 pg (ref 26.0–34.0)
MCHC: 33.9 g/dL (ref 32.0–36.0)
MCV: 88.9 fL (ref 80.0–100.0)
MONO ABS: 0.5 10*3/uL (ref 0.2–1.0)
Monocytes Relative: 6 %
Neutro Abs: 4.8 10*3/uL (ref 1.4–6.5)
Neutrophils Relative %: 60 %
PLATELETS: 322 10*3/uL (ref 150–440)
RBC: 5.39 MIL/uL (ref 4.40–5.90)
RDW: 14.8 % — AB (ref 11.5–14.5)
WBC: 8.1 10*3/uL (ref 3.8–10.6)

## 2016-06-24 LAB — LIPASE, BLOOD: LIPASE: 23 U/L (ref 11–51)

## 2016-06-24 MED ORDER — KETOROLAC TROMETHAMINE 30 MG/ML IJ SOLN: 30.0000 mg | Freq: Once | INTRAMUSCULAR | Status: DC

## 2016-06-24 MED ORDER — KETOROLAC TROMETHAMINE 30 MG/ML IJ SOLN
15.0000 mg | Freq: Once | INTRAMUSCULAR | Status: AC
  Administered 2016-06-24: 15 mg via INTRAVENOUS

## 2016-06-24 MED ORDER — IOPAMIDOL (ISOVUE-300) INJECTION 61%
100.0000 mL | Freq: Once | INTRAVENOUS | Status: AC | PRN
  Administered 2016-06-24: 100 mL via INTRAVENOUS

## 2016-06-24 MED ORDER — MORPHINE SULFATE (PF) 4 MG/ML IV SOLN
4.0000 mg | Freq: Once | INTRAVENOUS | Status: AC
  Administered 2016-06-24: 4 mg via INTRAVENOUS
  Filled 2016-06-24: qty 1

## 2016-06-24 MED ORDER — KETOROLAC TROMETHAMINE 30 MG/ML IJ SOLN
15.0000 mg | Freq: Once | INTRAMUSCULAR | Status: AC
  Administered 2016-06-24: 15 mg via INTRAVENOUS
  Filled 2016-06-24: qty 1

## 2016-06-24 MED ORDER — ONDANSETRON HCL 4 MG/2ML IJ SOLN
4.0000 mg | Freq: Once | INTRAMUSCULAR | Status: AC
  Administered 2016-06-24: 4 mg via INTRAVENOUS
  Filled 2016-06-24: qty 2

## 2016-06-24 MED ORDER — SODIUM CHLORIDE 0.9 % IV BOLUS (SEPSIS)
1000.0000 mL | Freq: Once | INTRAVENOUS | Status: AC
  Administered 2016-06-24: 1000 mL via INTRAVENOUS

## 2016-06-24 MED ORDER — KETOROLAC TROMETHAMINE 30 MG/ML IJ SOLN
INTRAMUSCULAR | Status: AC
  Filled 2016-06-24: qty 1

## 2016-06-24 MED ORDER — ONDANSETRON 4 MG PO TBDP: 4.0000 mg | ORAL_TABLET | Freq: Four times a day (QID) | ORAL | 0 refills | Status: DC | PRN

## 2016-06-24 MED ORDER — TAMSULOSIN HCL 0.4 MG PO CAPS: 0.4000 mg | ORAL_CAPSULE | Freq: Every day | ORAL | 0 refills | Status: DC

## 2016-06-24 MED ORDER — IOPAMIDOL (ISOVUE-300) INJECTION 61%
30.0000 mL | Freq: Once | INTRAVENOUS | Status: AC | PRN
Start: 2016-06-24 — End: 2016-06-24
  Administered 2016-06-24: 30 mL via ORAL

## 2016-06-24 MED ORDER — HYDROCODONE-ACETAMINOPHEN 5-325 MG PO TABS: 1.0000 | ORAL_TABLET | Freq: Four times a day (QID) | ORAL | 0 refills | Status: DC | PRN

## 2016-06-24 NOTE — Discharge Instructions (Signed)
You have been seen in the Emergency Department (ED) today for pain that we believe based on your workup, is caused by kidney stones.  As we have discussed, please drink plenty of fluids.  Please make a follow up appointment with the physician(s) listed elsewhere in this documentation.  You may take pain medication as needed but ONLY as prescribed.  Please also take your prescribed Flomax daily.  We also recommend that you take over-the-counter ibuprofen regularly according to label instructions over the next 5 days.  Take it with meals to minimize stomach discomfort.  Please see your doctor as soon as possible as stones may take 1-3 weeks to pass and you may require additional care or medications.  Do not drink alcohol, drive or participate in any other potentially dangerous activities while taking opiate pain medication as it may make you sleepy. Do not take this medication with any other sedating medications, either prescription or over-the-counter. If you were prescribed Percocet or Vicodin, do not take these with acetaminophen (Tylenol) as it is already contained within these medications.   This medication is an opiate (or narcotic) pain medication and can be habit forming.  Use it as little as possible to achieve adequate pain control.  Do not use or use it with extreme caution if you have a history of opiate abuse or dependence.  If you are on a pain contract with your primary care doctor or a pain specialist, be sure to let them know you were prescribed this medication today from the [facility] Emergency Department.  This medication is intended for your use only - do not give any to anyone else and keep it in a secure place where nobody else, especially children, have access to it.  It will also cause or worsen constipation, so you may want to consider taking an over-the-counter stool softener while you are taking this medication.  Return to the Emergency Department (ED) or call your doctor  if you have any worsening pain, fever, painful urination, are unable to urinate, or develop other symptoms that concern you.

## 2016-06-24 NOTE — ED Notes (Signed)
Dr. Jacqualine Code informed pts heart rate on cardiac monitor noted to drop into the upper 30's and then return to the 50's. Sinus brady on the monitor. No new orders at this time, will continue to monitor patient.

## 2016-06-24 NOTE — ED Notes (Signed)
Pt verbalized understanding of discharge instructions. NAD at this time. 

## 2016-06-24 NOTE — ED Triage Notes (Signed)
Pt complains of right flank pain radiating to back, pt denies any other symptoms, pt reports having a kidney stone a few weeks ago, pt denies fevers or dysuria

## 2016-06-24 NOTE — ED Provider Notes (Signed)
[facility] Emergency Department Provider Note   ____________________________________________   First MD Initiated Contact with Patient 06/24/16 [phone]     (approximate)  I have reviewed the triage vital signs and the nursing notes.   HISTORY  Spanish interpreter, Barnie Alderman utilized, but patient reports he did not need interpretive services however Caryn Bee was present to assist and answer any questions the patient had  Chief Complaint Flank Pain    HPI Anthony Castaneda is a 66 y.o. male reports for about the last day and a half teaspoon experiencing an off-and-on discomfort pointing towards his right flank. This morning about 6 AM he reports the pain became sharp and ongoing. Not associated with any vomiting, mild nausea.  Known changes in urine. Denies any chest pain or trouble breathing. He reports he has moderate to severe sharp pain in his right flank. 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  Past Medical History:  Diagnosis Date  . Hemolytic anemia ([place])   . Nephrolithiasis     Patient Active Problem List   Diagnosis Date Noted  . Hemolytic anemia ([place]) 06/19/2014    Past Surgical History:  Procedure Laterality Date  . APPENDECTOMY    . CHOLECYSTECTOMY    . COLONOSCOPY WITH PROPOFOL N/A 09/27/2015   Procedure: COLONOSCOPY WITH PROPOFOL;  Surgeon: Manya Silvas, MD;  Location: [facility] ENDOSCOPY;  Service: Endoscopy;  Laterality: N/A;  . SPLENECTOMY      Prior to Admission medications   Medication Sig Start Date End Date Taking? Authorizing Provider  HYDROcodone-acetaminophen (NORCO/VICODIN) 5-325 MG tablet Take 1 tablet by mouth every 6 (six) hours as needed for moderate pain. 06/24/16   Delman Kitten, MD  ondansetron (ZOFRAN ODT) 4 MG disintegrating tablet Take 1 tablet (4 mg total) by mouth every 6 (six)  hours as needed for nausea or vomiting. 06/24/16   Delman Kitten, MD  tamsulosin (FLOMAX) 0.4 MG CAPS capsule Take 1 capsule (0.4 mg total) by mouth daily. 06/24/16   Delman Kitten, MD  Patient reports he is no longer taking any medications.  Allergies Patient has no known allergies.  No family history on file.  Social History Social History  Substance Use Topics  . Smoking status: Former Research scientist (life sciences)  . Smokeless tobacco: Never Used     Comment: At the age of 57; he smoked for 2 years and then quit...  . Alcohol use No    Review of Systems Constitutional: No fever/chills Eyes: No visual changes. ENT: No sore throat. Cardiovascular: Denies chest pain. Respiratory: Denies shortness of breath. Gastrointestinal: Previous cholecystectomy No nausea, no vomiting.  No diarrhea.  No constipation. Genitourinary: Negative for dysuria. Musculoskeletal: Negative for back pain. Skin: Negative for rash. Neurological: Negative for headaches, focal weakness or numbness.    ____________________________________________   PHYSICAL EXAM:  VITAL SIGNS: ED Triage Vitals [06/24/16 0741]  Enc Vitals Group     BP (!) 186/90     Pulse Rate (!) 57     Resp 20     Temp 97.7 F (36.5 C)     Temp Source Oral     SpO2 98 %     Weight 165 lb (74.8 kg)     Height 5\' 6"  (1.676 m)     Head Circumference      Peak Flow      Pain Score 9  Pain Loc      Pain Edu?      Excl. in [place]?     Constitutional: Alert and oriented. Well appearing and in no acute distress. Eyes: Conjunctivae are normal. Head: Atraumatic. Nose: No congestion/rhinnorhea. Mouth/Throat: Mucous membranes are moist. Neck: No stridor.   Cardiovascular: Normal rate, regular rhythm. Grossly normal heart sounds.  Good peripheral circulation. Respiratory: Normal respiratory effort.  No retractions. Lungs CTAB. Gastrointestinal: Soft and nontender except in the right flank. No distention. Moderate right-sided CVA tenderness, none on the  left. No focal pain at McBurney's point. Negative Murphy. Mild tenderness to palpation in the right flank without rebound or guarding Musculoskeletal: No lower extremity tenderness nor edema. Neurologic:  Normal speech and language. No gross focal neurologic deficits are appreciated.  Skin:  Skin is warm, dry and intact. No rash noted. Psychiatric: Mood and affect are normal. Speech and behavior are normal.  ____________________________________________   LABS (all labs ordered are listed, but only abnormal results are displayed)  Labs Reviewed  COMPREHENSIVE METABOLIC PANEL - Abnormal; Notable for the following:       Result Value   Glucose, Bld 110 (*)    Calcium 8.7 (*)    All other components within normal limits  CBC WITH DIFFERENTIAL/PLATELET - Abnormal; Notable for the following:    RDW 14.8 (*)    Basophils Absolute 0.2 (*)    All other components within normal limits  URINALYSIS, COMPLETE (UACMP) WITH MICROSCOPIC - Abnormal; Notable for the following:    Color, Urine YELLOW (*)    APPearance CLEAR (*)    Hgb urine dipstick MODERATE (*)    All other components within normal limits  LIPASE, BLOOD  PROTIME-INR   ____________________________________________  EKG   ____________________________________________  RADIOLOGY  Ct Abdomen Pelvis W Contrast  Result Date: 06/24/2016 CLINICAL DATA:  Right lower quadrant abdominal pain extending to the back. Right flank pain. EXAM: CT ABDOMEN AND PELVIS WITH CONTRAST TECHNIQUE: Multidetector CT imaging of the abdomen and pelvis was performed using the standard protocol following bolus administration of intravenous contrast. CONTRAST:  12mL ISOVUE-300 IOPAMIDOL (ISOVUE-300) INJECTION 61% COMPARISON:  One-view abdomen 05/30/2016. CT of the abdomen pelvis 08/15/2013 FINDINGS: Lower chest: Mild dependent atelectasis is present bilaterally. Heart size is normal. No significant pleural or pericardial effusion is present. Hepatobiliary:  Fatty infiltration of the liver is suspected. No discrete lesions are present. The liver contour is smooth. The common bile duct is within normal limits following cholecystectomy. Pancreas: Unremarkable. No pancreatic ductal dilatation or surrounding inflammatory changes. Spleen: Splenectomy is noted. Adrenals/Urinary Tract: The adrenal glands are normal bilaterally. Marked inflammatory changes are evident about the right kidney. A delayed nephrogram is associated with moderate right-sided hydronephrosis. An obstructing 6 mm stone is present at the right UPJ. Additional nonobstructing 3 mm stone is evident within the right kidney. A punctate nonobstructing stone is present in the midportion of the left kidney. The more distal right ureter is within normal limits. The urinary bladder is normal bilaterally. Stomach/Bowel: The stomach is moderately distended. No obstruction is evident. The duodenum is normal. Small bowel is within normal limits. The appendix is visualized and normal. The ascending and transverse colon are within normal limits. Diverticular present in the descending and sigmoid colon without inflammation to suggest diverticulitis. Vascular/Lymphatic: Mild atherosclerotic calcifications are present in the aorta and branch vessels without aneurysm. No significant adenopathy is present. Reproductive: The prostate gland is mildly prominent without significant interval change. Transverse diameter is 4.9 cm. Other: No  abdominal wall hernia or abnormality. No abdominopelvic ascites. Musculoskeletal: Vertebral body heights and alignment are maintained. No focal lytic or blastic lesions are present. The bony pelvis is intact. The hips are located and within normal limits bilaterally. IMPRESSION: 1. Moderate right-sided hydronephrosis and delayed nephrogram secondary to ureteral obstruction by 6 mm stone at the right UPJ. 2. Additional nonobstructing 3 mm stone of the right kidney and punctate nonobstructing  stone in the left kidney. 3. Mild atherosclerosis. 4. Cholecystectomy. 5. Sigmoid diverticulosis without diverticulitis. Electronically Signed   By: San Morelle M.D.   On: 06/24/2016 11:32    ____________________________________________   PROCEDURES  Procedure(s) performed: None  Procedures  Critical Care performed: No  ____________________________________________   INITIAL IMPRESSION / ASSESSMENT AND PLAN / ED COURSE  Pertinent labs & imaging results that were available during my care of the patient were reviewed by me and considered in my medical decision making (see chart for details).  Differential diagnosis includes but is not limited to, abdominal perforation, aortic dissection, cholecystitis, appendicitis, diverticulitis, colitis, esophagitis/gastritis, kidney stone, pyelonephritis, urinary tract infection, aortic aneurysm. All are considered in decision and treatment plan. Based upon the patient's presentation and risk factors, I suspect he likely has another case of kidney stone, though his blood pressure is elevated I suspect this is likely secondary to pain response. I will obtain urinalysis, abdominal labs, and plan to proceed with imaging likely with CT scan to further evaluate and exclude other diagnoses.  The patient did drive here, he was instructed that if he is discharged she will need to have family come pick him up as he cannot drive for 6 hours after receiving morphine. He is agreeable to this.   Clinical Course as of Jun 25 1151  Sat Jun 24, 2016  0834 The patient reports his pain is worsening despite having received morphine. His chemistry is now returned, now TORADOL and additional morphine. He does appear uncomfortable reporting ongoing significant pain in the right flank.  [MQ]  [phone] Patient is to have some sinus bradycardia, heart rate occasionally to the upper 40s, primarily in the 50s. He is resting much more comfortably reports his pain is  improved. He is been able to ambulate without distress. Urinalysis reviewed,  [MQ]    Clinical Course User Index [MQ] Delman Kitten, MD    ----------------------------------------- 11:53 AM on 06/24/2016 -----------------------------------------  Pain controlled well. Normal hemodynamics. Resting comfortably at this time. CT reveals a 6 mm stone at the UPJ. Expectant management at this time. Return precautions and treatment recommendations and follow-up discussed with the patient who is agreeable with the plan.  I will prescribe the patient a narcotic pain medicine due to their condition which I anticipate will cause at least moderate pain short term. I discussed with the patient safe use of narcotic pain medicines, and that they are not to drive, work in dangerous areas, or ever take more than prescribed (no more than 1 pill every 6 hours). We discussed that this is the type of medication that can be  overdosed on and the risks of this type of medicine. Patient is very agreeable to only use as prescribed and to never use more than prescribed.  ____________________________________________   FINAL CLINICAL IMPRESSION(S) / ED DIAGNOSES  Final diagnoses:  Kidney stone on right side      NEW MEDICATIONS STARTED DURING THIS VISIT:  New Prescriptions   HYDROCODONE-ACETAMINOPHEN (NORCO/VICODIN) 5-325 MG TABLET    Take 1 tablet by mouth every 6 (six) hours  as needed for moderate pain.   ONDANSETRON (ZOFRAN ODT) 4 MG DISINTEGRATING TABLET    Take 1 tablet (4 mg total) by mouth every 6 (six) hours as needed for nausea or vomiting.   TAMSULOSIN (FLOMAX) 0.4 MG CAPS CAPSULE    Take 1 capsule (0.4 mg total) by mouth daily.     Note:  This document was prepared using Dragon voice recognition software and may include unintentional dictation errors.     Delman Kitten, MD 06/24/16 1154

## 2016-06-24 NOTE — ED Notes (Signed)
Patient transported to CT 

## 2016-06-26 ENCOUNTER — Emergency Department: Payer: Commercial Managed Care - HMO

## 2016-06-26 ENCOUNTER — Emergency Department
Admission: EM | Admit: 2016-06-26 | Discharge: 2016-06-26 | Disposition: A | Discharge status: Home / Self Care | Payer: Commercial Managed Care - HMO | Attending: Emergency Medicine | Admitting: Emergency Medicine

## 2016-06-26 DIAGNOSIS — Z87891 Personal history of nicotine dependence: Secondary | ICD-10-CM | POA: Diagnosis not present

## 2016-06-26 DIAGNOSIS — N289 Disorder of kidney and ureter, unspecified: Secondary | ICD-10-CM | POA: Diagnosis not present

## 2016-06-26 DIAGNOSIS — R1031 Right lower quadrant pain: Secondary | ICD-10-CM | POA: Diagnosis present

## 2016-06-26 DIAGNOSIS — N200 Calculus of kidney: Secondary | ICD-10-CM | POA: Diagnosis not present

## 2016-06-26 LAB — URINALYSIS, COMPLETE (UACMP) WITH MICROSCOPIC
Bacteria, UA: NONE SEEN
Bilirubin Urine: NEGATIVE
GLUCOSE, UA: NEGATIVE mg/dL
KETONES UR: NEGATIVE mg/dL
LEUKOCYTES UA: NEGATIVE
NITRITE: NEGATIVE
PH: 6 (ref 5.0–8.0)
Protein, ur: NEGATIVE mg/dL
Specific Gravity, Urine: 1.017 (ref 1.005–1.030)
Squamous Epithelial / LPF: NONE SEEN

## 2016-06-26 LAB — BASIC METABOLIC PANEL
Anion gap: 7 (ref 5–15)
BUN: 14 mg/dL (ref 6–20)
CALCIUM: 8.4 mg/dL — AB (ref 8.9–10.3)
CHLORIDE: 101 mmol/L (ref 101–111)
CO2: 27 mmol/L (ref 22–32)
CREATININE: 1.51 mg/dL — AB (ref 0.61–1.24)
GFR calc Af Amer: 55 mL/min — ABNORMAL LOW (ref >60)
GFR calc non Af Amer: 47 mL/min — ABNORMAL LOW (ref >60)
Glucose, Bld: 104 mg/dL — ABNORMAL HIGH (ref 65–99)
Potassium: 3.3 mmol/L — ABNORMAL LOW (ref 3.5–5.1)
Sodium: 135 mmol/L (ref 135–145)

## 2016-06-26 LAB — CBC
HCT: 46.5 % (ref 40.0–52.0)
HEMOGLOBIN: 15.8 g/dL (ref 13.0–18.0)
MCH: 30 pg (ref 26.0–34.0)
MCHC: 34 g/dL (ref 32.0–36.0)
MCV: 88.1 fL (ref 80.0–100.0)
Platelets: 308 10*3/uL (ref 150–440)
RBC: 5.28 MIL/uL (ref 4.40–5.90)
RDW: 14.9 % — AB (ref 11.5–14.5)
WBC: 10.1 10*3/uL (ref 3.8–10.6)

## 2016-06-26 MED ORDER — HYDROMORPHONE HCL 1 MG/ML IJ SOLN
0.5000 mg | Freq: Once | INTRAMUSCULAR | Status: AC
  Administered 2016-06-26: 0.5 mg via INTRAVENOUS
  Filled 2016-06-26: qty 1

## 2016-06-26 MED ORDER — KETOROLAC TROMETHAMINE 30 MG/ML IJ SOLN
30.0000 mg | Freq: Once | INTRAMUSCULAR | Status: AC
  Administered 2016-06-26: 30 mg via INTRAVENOUS
  Filled 2016-06-26: qty 1

## 2016-06-26 MED ORDER — SODIUM CHLORIDE 0.9 % IV BOLUS (SEPSIS)
1000.0000 mL | Freq: Once | INTRAVENOUS | Status: AC
  Administered 2016-06-26: 1000 mL via INTRAVENOUS

## 2016-06-26 MED ORDER — ONDANSETRON HCL 4 MG/2ML IJ SOLN
4.0000 mg | Freq: Once | INTRAMUSCULAR | Status: AC
  Administered 2016-06-26: 4 mg via INTRAVENOUS
  Filled 2016-06-26: qty 2

## 2016-06-26 NOTE — ED Provider Notes (Signed)
[facility] Emergency Department Provider Note  ____________________________________________  Time seen: Approximately 7:19 AM  I have reviewed the triage vital signs and the nursing notes.   HISTORY  Chief Complaint Abdominal Pain    HPI Anthony Castaneda is a 66 y.o. male [name] with a 6 mm right UPJ stone 6/16 presenting with right flank and right lower quadrant pain. The patient reports that after being seen here, his symptoms were well controlled until yesterday at 3 PM when he developed right lower quadrant pain. He treated his pain successfully with ibuprofen, but then was urinating every 30 minutes throughout the night and had worsening pain this morning in the right flank and the right lower quadrant which is sharp and stabbing. He denies any nausea or vomiting, but did take Zofran and hydrocodone, without improvement. The only thing that makes his pain better is ibuprofen. He denies any fever or chills, hematuria, lightheadedness or syncope. The patient does not appear to be taking his prescribed Flomax, nor to be taking his other symptomatic medications that were prescribed on Saturday as prescribed.    Past Medical History:  Diagnosis Date  . Hemolytic anemia ([place])   . Nephrolithiasis     Patient Active Problem List   Diagnosis Date Noted  . Hemolytic anemia ([place]) 06/19/2014    Past Surgical History:  Procedure Laterality Date  . APPENDECTOMY    . CHOLECYSTECTOMY    . COLONOSCOPY WITH PROPOFOL N/A 09/27/2015   Procedure: COLONOSCOPY WITH PROPOFOL;  Surgeon: Manya Silvas, MD;  Location: [facility] ENDOSCOPY;  Service: Endoscopy;  Laterality: N/A;  . SPLENECTOMY      Current Outpatient Rx  . Order #: [id number] Class: Print  . Order #: [id number] Class: Print  . Order #: [id number] Class: Print    Allergies Patient has no known allergies.  No family history on file.  Social History Social History  Substance Use Topics  . Smoking status: Former  Research scientist (life sciences)  . Smokeless tobacco: Never Used     Comment: At the age of 37; he smoked for 2 years and then quit...  . Alcohol use No    Review of Systems Constitutional: No fever/chills. Eyes: No visual changes. ENT: No sore throat. No congestion or rhinorrhea. Cardiovascular: Denies chest pain. Denies palpitations. Respiratory: Denies shortness of breath.  No cough. Gastrointestinal: Positive right flank pain and right lower quadrant abdominal pain.  No nausea, no vomiting.  No diarrhea.  No constipation. Genitourinary: Negative for dysuria. Positive urinary frequency. Negative hematuria. Musculoskeletal: Negative for back pain except for the flank pain. Skin: Negative for rash. Neurological: Negative for headaches. No focal numbness, tingling or weakness.     ____________________________________________   PHYSICAL EXAM:  VITAL SIGNS: ED Triage Vitals  Enc Vitals Group     BP 06/26/16 0554 (!) 165/78     Pulse Rate 06/26/16 0554 60     Resp 06/26/16 0554 20     Temp 06/26/16 0554 98.3 F (36.8 C)     Temp Source 06/26/16 0554 Oral     SpO2 06/26/16 0554 97 %     Weight --      Height --      Head Circumference --      Peak Flow --      Pain Score 06/26/16 0553 8     Pain Loc --      Pain Edu? --      Excl. in [place]? --     Constitutional: Alert and oriented. Mildly uncomfortablering  butn no acute distress. Answers questions appropriately. Eyes: Conjunctivae are normal.  EOMI. No scleral icterus. Head: Atraumatic. Nose: No congestion/rhinnorhea. Mouth/Throat: Mucous membranes are moist.  Neck: No stridor.  Supple.   Cardiovascular: Normal rate, regular rhythm. No murmurs, rubs or gallops.  Respiratory: Normal respiratory effort.  No accessory muscle use or retractions. Lungs CTAB.  No wheezes, rales or ronchi. Gastrointestinal:  tender to palpation in the right CVA. Soft, and nondistended.  Minimally tender in the right lower quadrant. No guarding or rebound.  No  peritoneal signs. Musculoskeletal: No LE edema.  Neurologic:  A&Ox3.  Speech is clear.  Face and smile are symmetric.  EOMI.  Moves all extremities well. Skin:  Skin is warm, dry and intact. No rash noted. Psychiatric: Mood and affect are normal. Speech and behavior are normal.  Normal judgement.  ____________________________________________   LABS (all labs ordered are listed, but only abnormal results are displayed)  Labs Reviewed  URINALYSIS, COMPLETE (UACMP) WITH MICROSCOPIC - Abnormal; Notable for the following:       Result Value   Color, Urine YELLOW (*)    APPearance CLEAR (*)    Hgb urine dipstick SMALL (*)    All other components within normal limits  CBC - Abnormal; Notable for the following:    RDW 14.9 (*)    All other components within normal limits  BASIC METABOLIC PANEL - Abnormal; Notable for the following:    Potassium 3.3 (*)    Glucose, Bld 104 (*)    Creatinine, Ser 1.51 (*)    Calcium 8.4 (*)    GFR calc non Af Amer 47 (*)    GFR calc Af Amer 55 (*)    All other components within normal limits   ____________________________________________  EKG Not indicated__________________________________________  RADIOLOGY  Dg Abdomen 1 View  Result Date: 06/26/2016 CLINICAL DATA:  Ureteral calculus EXAM: ABDOMEN - 1 VIEW COMPARISON:  06/24/2016 FINDINGS: There is retained contrast in the right renal collecting system. This extends throughout the course of the ureter, the level of the ureterovesical junction. At this level, there is a 5 mm calculus. There is also enteric contrast in the colon. No disproportionate dilatation of bowel. Tiny calculus in the left kidney is noted. IMPRESSION: The calculus in the proximal right ureter has passed to the right ureterovesical junction. Electronically Signed   By: Marybelle Killings M.D.   On: 06/26/2016 07:44    ____________________________________________   PROCEDURES  Procedure(s) performed: None  Procedures  Critical  Care performed: No ____________________________________________   INITIAL IMPRESSION / ASSESSMENT AND PLAN / ED COURSE  Pertinent labs & imaging results that were available during my care of the patient were reviewed by me and considered in my medical decision making (see chart for details).  66 y.o. male with a known 6 mm right-sided kidney stone seen at the UPJ 3 days ago presenting with ongoing symptoms, but not taking his prescribed medication for symptomatic relief. Today, the patient is hemodynamically stable and afebrile. His urinalysis does not show any sign of infection. His white blood cell count is normal, but he does have a bump in his creatinine from 1.01-1.51. At this time, I'll give him intravenous fluid and IV symptom control, and talked to urology about his outpatient follow-up. If I'm able to get him comfortable, and adequately hydrate him, he likely can be discharged home with close follow-up.  ----------------------------------------- 8:16 AM on 06/26/2016 -----------------------------------------  The patient's workup in the emergency department has been reassuring. His  right UPJ stone has now moved to the UVJ, and is making good progress. This is likely the cause of his pain today.  He does have a bump in his creatinine from 1.01-1.51; I have encouraged him to be more aggressive about fluid intake. He does not have an elevated white blood cell count in his urine is not infected. At this time, the patient states that his pain has not improved with Toradol, Cipro ordered Dilaudid. I spoke with Dr. Louis Meckel, the urologist on-call, who agrees that the patient's stone is likely moving well on its own and that the patient has safe for discharge. He has put in a request to have the patient called for an appointment in the next week.  Plan discharge. Return precautions were discussed.  ____________________________________________  FINAL CLINICAL IMPRESSION(S) / ED DIAGNOSES  Final  diagnoses:  Renal stone  Acute renal insufficiency         NEW MEDICATIONS STARTED DURING THIS VISIT:  New Prescriptions   No medications on file      Eula Listen, MD 06/26/16 [phone]

## 2016-06-26 NOTE — ED Notes (Signed)
PT reports ride will meet him outside. Pt ambulatory at this time and in NAD.

## 2016-06-26 NOTE — ED Triage Notes (Signed)
Patient reports having right lower quad pain that has now moved to right flank pain.  Patient also reports urinary urgency.

## 2016-06-26 NOTE — ED Notes (Signed)
Pt instructed to call ride to take him home. Pt reports he has already called and will wait for fluids to finish.

## 2016-06-26 NOTE — Discharge Instructions (Signed)
Please take the medications that you were prescribed on Saturday.  Take the Flomax daily, regardless of symptoms.  Take Motrin or Tylenol for mild to moderate pain, and Hydrocodone for severe pain.  Do not drive within 8 hours of taking Hydrocodone.  Zofran is for nausea or vomiting.    Drink plenty of fluids; this will help your kidney stone continue to move, and will protect your kidney function.  Return to the emergency department if you develop severe pain, fever, inability to keep down fluids, or any other symptoms concerning to you.

## 2016-06-27 ENCOUNTER — Telehealth: Payer: Self-pay | Admitting: Urology

## 2016-06-27 NOTE — Telephone Encounter (Signed)
done

## 2016-06-27 NOTE — Telephone Encounter (Signed)
Called patient to schedule app no voicemail so I made app and mailed it to the patient.  Anthony Castaneda

## 2016-06-27 NOTE — Telephone Encounter (Signed)
-----   Message from Ardis Hughs, MD sent at 06/26/2016  8:11 AM EDT ----- Regarding: f/u Patient has a 3mm distal stone and needs follow-up.  Can you please schedule him for f/u in one week.  Anthony Castaneda

## 2016-07-04 ENCOUNTER — Ambulatory Visit: Payer: Self-pay

## 2017-10-23 ENCOUNTER — Ambulatory Visit: Payer: 59 | Admitting: Urology

## 2017-10-23 ENCOUNTER — Encounter: Payer: Self-pay | Admitting: Urology

## 2017-10-23 ENCOUNTER — Other Ambulatory Visit: Payer: Self-pay

## 2017-10-23 VITALS — BP 162/91 | HR 65 | Ht 65.0 in | Wt 169.1 lb

## 2017-10-23 DIAGNOSIS — N401 Enlarged prostate with lower urinary tract symptoms: Secondary | ICD-10-CM

## 2017-10-23 DIAGNOSIS — R972 Elevated prostate specific antigen [PSA]: Secondary | ICD-10-CM | POA: Diagnosis not present

## 2017-10-23 DIAGNOSIS — N138 Other obstructive and reflux uropathy: Secondary | ICD-10-CM

## 2017-10-23 LAB — URINALYSIS, COMPLETE
Bilirubin, UA: NEGATIVE
Glucose, UA: NEGATIVE
KETONES UA: NEGATIVE
LEUKOCYTES UA: NEGATIVE
Nitrite, UA: NEGATIVE
Protein, UA: NEGATIVE
Specific Gravity, UA: 1.025 (ref 1.005–1.030)
Urobilinogen, Ur: 0.2 mg/dL (ref 0.2–1.0)
pH, UA: 5.5 (ref 5.0–7.5)

## 2017-10-23 LAB — MICROSCOPIC EXAMINATION
Bacteria, UA: NONE SEEN
EPITHELIAL CELLS (NON RENAL): NONE SEEN /HPF (ref 0–10)
WBC, UA: NONE SEEN /hpf (ref 0–5)

## 2017-10-23 NOTE — Progress Notes (Signed)
10/23/2017 2:37 PM   Anthony Castaneda May 09, 1950 [id number]  Referring provider: Theotis Burrow, MD [address] [place], [place] [address]  CC: Elevated PSA, 7.2  HPI: I had the pleasure of meeting Mr. Anthony Castaneda in urology clinic in consultation for elevated PSA from Dr. Alene Mires.  Today's visit was conducted via a Patent attorney.  He is a 67 year old healthy male with a past medical history notable for hypertension who was found to have an elevated PSA of 7.2.  He denies any urinary symptoms.  There is no family history of prostate cancer.  His past surgical history is notable for a cholecystectomy, splenectomy, and appendectomy.  He denies any erectile dysfunction or hematuria.  There are no aggravating or alleviating factors.  Severity is mild to moderate.  Duration is 1 month.  He also has a history of kidney stones.   PMH: Past Medical History:  Diagnosis Date  . Hemolytic anemia ([place])   . Nephrolithiasis     Surgical History: Past Surgical History:  Procedure Laterality Date  . APPENDECTOMY    . CHOLECYSTECTOMY    . COLONOSCOPY WITH PROPOFOL N/A 09/27/2015   Procedure: COLONOSCOPY WITH PROPOFOL;  Surgeon: Manya Silvas, MD;  Location: [facility] ENDOSCOPY;  Service: Endoscopy;  Laterality: N/A;  . SPLENECTOMY      Allergies: No Known Allergies  Family History: History reviewed. No pertinent family history.  Social History:  reports that he has quit smoking. He has never used smokeless tobacco. He reports that he does not drink alcohol or use drugs.  ROS: Please see flowsheet from today's date for complete review of systems.  Physical Exam: BP (!) 162/91   Pulse 65   Ht 5\' 5"  (1.651 m)   Wt 169 lb 1.6 oz (76.7 kg)   BMI 28.14 kg/m    Constitutional:  Alert and oriented, No acute distress. Cardiovascular: No clubbing, cyanosis, or edema. Respiratory: Normal respiratory effort, no increased work of breathing. GI: Abdomen is soft, nontender,  nondistended, no abdominal masses GU: No CVA tenderness DRE: 50 g, smooth, slight firmness at the left lateral lobe. Lymph: No cervical or inguinal lymphadenopathy. Skin: No rashes, bruises or suspicious lesions. Neurologic: Grossly intact, no focal deficits, moving all 4 extremities. Psychiatric: Normal mood and affect.  Laboratory Data: PSA 7.2  Pertinent Imaging: I personally reviewed the CT abdomen pelvis with contrast dated 06/24/2016.  Prostate volume measures 48 g  Assessment & Plan:   In summary, Mr. Anthony Castaneda is a 67 year old relatively healthy male with an elevated PSA of 7.2.  We repeated his PSA in clinic today, and it is pending.  We reviewed the implications of an elevated PSA and the uncertainty surrounding it. In general, a man's PSA increases with age and is produced by both normal and cancerous prostate tissue. The differential diagnosis for elevated PSA includes BPH, prostate cancer, infection, recent intercourse/ejaculation, recent urethroscopic manipulation (foley placement/cystoscopy) or trauma, and prostatitis.   Management of an elevated PSA can include observation or prostate biopsy and we discussed this in detail. Our goal is to detect clinically significant prostate cancers, and manage with either active surveillance, surgery, or radiation for localized disease. Risks of prostate biopsy include bleeding, infection (including life threatening sepsis), pain, and lower urinary symptoms. Hematuria, hematospermia, and blood in the stool are all common after biopsy and can persist up to 4 weeks.   Will call with PSA results If PSA remains elevated, we will proceed with biopsy  Billey Co, MD  [facility] [address] [place], [place] [address] [phone]

## 2017-10-24 LAB — FPSA% REFLEX
% FREE PSA: 8.6 %
PSA, FREE: 0.63 ng/mL

## 2017-10-24 LAB — PSA TOTAL (REFLEX TO FREE): Prostate Specific Ag, Serum: 7.3 ng/mL — ABNORMAL HIGH (ref 0.0–4.0)

## 2017-10-25 ENCOUNTER — Telehealth: Payer: Self-pay | Admitting: Family Medicine

## 2017-10-25 NOTE — Telephone Encounter (Signed)
Patient notified and appointment made. Papers mailed with instructions

## 2017-10-25 NOTE — Telephone Encounter (Signed)
-----   Message from Billey Co, MD sent at 10/24/2017  8:18 AM EDT ----- PSA remains at elevated at 7.3, we need to schedule prostate biopsy. I reviewed biopsy with him in clinic already. Please let him know PSA results, review biopsy instructions, and schedule prostate biopsy. Will need translator.  Nickolas Madrid, MD 10/24/2017

## 2017-11-05 ENCOUNTER — Telehealth: Payer: Self-pay

## 2017-11-05 NOTE — Telephone Encounter (Signed)
Pt walked into clinic concerned about his procedure scheduled on a Tuesday 11/20/17. He would like to know if he can return to work the next day. 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 If not he would like to try and arrange his appointment to be on a Thursday, which would put him out until December or the new year. I told him I would discuss returning to work and the effects it may have on his post biopsy recovery. Please advise.

## 2017-11-05 NOTE — Telephone Encounter (Signed)
Patient can return to work day after prostate biopsy. However, if he would like to re-schedule for a different day, that is fine as well.  Nickolas Madrid, MD 11/05/2017

## 2017-11-05 NOTE — Telephone Encounter (Signed)
Opened in error

## 2017-11-12 NOTE — Telephone Encounter (Signed)
Pt informed

## 2017-11-20 ENCOUNTER — Other Ambulatory Visit: Payer: Self-pay | Admitting: Urology

## 2017-11-20 ENCOUNTER — Other Ambulatory Visit: Payer: Self-pay

## 2017-11-20 ENCOUNTER — Encounter: Payer: Self-pay | Admitting: Urology

## 2017-11-20 ENCOUNTER — Ambulatory Visit: Payer: 59 | Admitting: Urology

## 2017-11-20 VITALS — BP 159/87 | HR 70 | Ht 65.0 in | Wt 165.4 lb

## 2017-11-20 DIAGNOSIS — R972 Elevated prostate specific antigen [PSA]: Secondary | ICD-10-CM

## 2017-11-20 MED ORDER — GENTAMICIN SULFATE 40 MG/ML IJ SOLN
80.0000 mg | Freq: Once | INTRAMUSCULAR | Status: AC
  Administered 2017-11-20: 80 mg via INTRAMUSCULAR

## 2017-11-20 MED ORDER — LEVOFLOXACIN 500 MG PO TABS
500.0000 mg | ORAL_TABLET | Freq: Once | ORAL | Status: AC
  Administered 2017-11-20: 500 mg via ORAL

## 2017-11-20 NOTE — Progress Notes (Signed)
   11/20/17  Indication: Elevated PSA, 7.3 (8.6% free)  Prostate Biopsy Procedure   Informed consent was obtained, and we discussed the risks of bleeding and infection/sepsis. A time out was performed to ensure correct patient identity.  Pre-Procedure: - Last PSA Level: 7.3 (8.6% free) - Gentamicin and levaquin given for antibiotic prophylaxis - Transrectal Ultrasound performed revealing a 49 gm prostate - No significant hypoechoic or median lobe noted  Procedure: - Prostate block performed using 10 cc 1% lidocaine and biopsies taken from sextant areas, a total of 12 under ultrasound guidance.  Post-Procedure: - Patient tolerated the procedure well - He was counseled to seek immediate medical attention if experiences significant bleeding, fevers, or severe pain - Return in one week to discuss biopsy results  Assessment/ Plan: Will follow up in 1-2 weeks to discuss pathology  Nickolas Madrid, MD 11/20/2017

## 2017-11-23 LAB — PATHOLOGY REPORT

## 2017-11-26 ENCOUNTER — Other Ambulatory Visit: Payer: Self-pay | Admitting: Urology

## 2017-12-04 ENCOUNTER — Ambulatory Visit (INDEPENDENT_AMBULATORY_CARE_PROVIDER_SITE_OTHER): Payer: 59 | Admitting: Urology

## 2017-12-04 ENCOUNTER — Encounter: Payer: Self-pay | Admitting: Urology

## 2017-12-04 VITALS — BP 148/76 | HR 62 | Ht 65.0 in | Wt 168.1 lb

## 2017-12-04 DIAGNOSIS — C61 Malignant neoplasm of prostate: Secondary | ICD-10-CM | POA: Diagnosis not present

## 2017-12-04 NOTE — Progress Notes (Addendum)
   12/04/2017 4:35 PM   FREDDI SCHRAGER 01/24/1950 [id number]  Reason for visit: Discuss prostate biopsy results  Today's visit was conducted via a Patent attorney.  Mr. Anthony Castaneda is a 67 year old man here today to discuss his prostate biopsy results.  He underwent prostate biopsy for an elevated PSA of 7.3.  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  This was primarily Gleason score 3+4 = 7 disease, however he did have a single core of Gleason score 4+3=7 in the left medial mid aspect of the gland, with max core involvement of 12%.  He has had multiple abdominal surgeries including an open splenectomy, and a laparoscopic cholecystectomy.  He denies any significant urinary symptoms.  He denies any erectile dysfunction.  We had a lengthy conversation today about the patient's new diagnosis of prostate cancer.  We reviewed the risk classifications per the AUA guidelines including very low risk, low risk, intermediate risk, and high risk disease, and the need for additional staging imaging with CT and bone scan in patients with unfavorable intermediate risk and high risk disease.  I explained that his life expectancy, clinical stage, Gleason score, PSA, and other co-morbidities influence treatment strategies.  We discussed the roles of active surveillance, radiation therapy, surgical therapy with robotic prostatectomy, and hormone therapy with androgen deprivation.  We discussed that patients urinary symptoms also impact treatment strategy, as patients with severe lower urinary tract symptoms may have significant worsening or even develop urinary retention after undergoing radiation.  In regards to surgery, we discussed robotic prostatectomy +/- lymphadenectomy at length.  The procedure takes 3 to 4 hours, and patient's typically discharge home on post-op day #1.  A Foley catheter is left in place for 7 to 10 days to allow for healing of the  vesicourethral anastomosis.  There is a small risk of bleeding, infection, damage to surrounding structures or bowel, hernia, DVT/PE, or serious cardiac or pulmonary complications.  We discussed at length post-op side effects including erectile dysfunction, and the importance of pre-operative erectile function on long-term outcomes.  Even with a nerve sparing approach, there is an approximately 25% rate of permanent erectile dysfunction.  We also discussed postop urinary incontinence at length.  We expect patients to have stress incontinence post-operatively that will improve over period of weeks to months.  Less than 10% of men will require a pad at 1 year after surgery.  Patients will need to avoid heavy lifting and strenuous activity for 3 to 4 weeks, but most men return to their baseline activity status by 6 weeks.  Notably, the patient had a very difficult time with this new diagnosis today and was visibly distraught.  He is leaning toward radiation, as he will miss the least amount of work.  I also agree that radiation is likely his best option in the setting of prior extensive abdominal surgeries.  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  Billey Co, [facility] [address], [place], [place] [address] [phone]

## 2017-12-04 NOTE — Patient Instructions (Signed)
Braquiterapia para el cncer de prstata (Brachytherapy for Prostate Cancer) La braquiterapia para el cncer de prstata es el tratamiento de radiacin que se administra desde el interior de la prstata en lugar de Nature conservation officer desde el exterior por medio de un rayo externo. Hay dos tipos de braquiterapia:  Terapia de bajas dosis. Se utilizan semillas o grnulos.  Terapia de altas dosis. Se administra a travs de tubos que Physicist, medical. Cuando se utiliza el mtodo de semillas (tambin llamado terapia de implante), se colocan entre 19 y 120 pequeos grnulos y se Administrator, Civil Service. Debido a que la radiacin no va mucho ms all de la prstata, los tejidos sanos, no cancerosos, que rodean la prstata reciben solo una pequea dosis de radiacin, lo que ayuda a protegerlos de Hydrographic surveyor. La radiacin desaparece luego de algunos meses. Si se emplea la terapia de altas dosis, los delgados tubos de radiacin se retiran despus del tratamiento y no queda radiacin en la prstata cuando usted se retira del hospital. [place] tipo de tratamiento se contina con una serie de aplicaciones de radiacin con un mtodo de rayo externo de manera ambulatoria. INFORME A SU MDICO:  Cualquier alergia que tenga.  Todos los [employer], incluidos anticoagulantes, vitaminas, hierbas, gotas oftlmicas, cremas y medicamentos de venta libre.  Problemas previos que usted o los [employer] de su familia hayan tenido con el uso de anestsicos.  Enfermedades de la sangre que haya sufrido.  Cirugas previas.  Enfermedades que tenga.  RIESGOS Y COMPLICACIONES Generalmente, la braquiterapia para el cncer de prstata es un procedimiento seguro. Sin embargo, pueden surgir [employer]. Los posibles problemas a corto plazo incluyen lo siguiente:  Tener dificultad para Garment/textile technologist. Podra necesitar de un catter [employer] o un mes.  Notar sangre o semen en la orina.  Tener sensacin de  estreimiento debido a la hinchazn de Animal nutritionist.  Sentir con frecuencia que Ship broker. Los posibles problemas a largo plazo incluyen lo siguiente:  Inflamacin del recto. Esto ocurre en aproximadamente el 2% de las personas que pasan por este procedimiento.  Problemas de ereccin. Esto depende de la edad y ocurre en aproximadamente el 15% al 40% de los hombres.  Dificultad para orinar. Esto se debe a Scientist, product/process development.  Diarrea. ANTES DEL PROCEDIMIENTO Le programarn algunos estudios, que podrn incluir ecografa, tomografa computarizada o resonancia magntica. Estos estudios no son dolorosos. Un especialista en radioterapia se reunir con usted y decidir qu tipo de braquiterapia usar, segn los [place] de estos estudios por imgenes. PROCEDIMIENTO  Le administrarn un medicamento que lo har dormir (anestesia general).  Le colocarn una pequea sonda en el recto.  Se utilizarn ondas de ultrasonido para guiar la insercin de pequeos tubos hacia la prstata, en ubicaciones planificadas con anticipacin.  Si se [place] el mtodo de semillas (dosis bajas): ? Se colocarn pequeos granos del tamao de un grano de arroz dentro del tubo. ? Luego el tubo se retirar, dejando las semillas en la prstata.  Si se [place] el mtodo de dosis altas: ? Se introducirn cables radiactivos, que se dejarn hasta que se haya administrado una dosis especfica de radiacin. ? Luego, los cables se retirarn.  En ambos mtodos, por lo general se coloca un catter en la vejiga.  DESPUS DEL PROCEDIMIENTO El seguimiento depende del tipo de tratamiento administrado. Con el tratamiento de altas dosis, no hay riesgo de radiacin posterior. Generalmente se realiza un tratamiento adicional con rayos externos de radiacin.  Esto requerir que el paciente ambulatorio visite la clnica con posterioridad. Con el implante de semillas, algunos mdicos recomiendan que se limite el  contacto cercano con nios y mujeres [place], debido a que pueden quedar en la prstata dosis bajas de radiacin. Esta radiacin desaparecer luego de 2 meses, y Lyndal Pulley 4 a 6 meses ms tarde, la radiacin ser casi indetectable. Esta informacin no tiene Marine scientist el consejo del mdico. Asegrese de hacerle al mdico cualquier pregunta que tenga. Document Released: 08/28/2012 Document Revised: 01/16/2014 Document Reviewed: 06/18/2012 Elsevier Interactive Patient Education  2017 [place] prstata (Prostate Cancer) El cncer de prstata es el crecimiento anormal de clulas en la prstata. La prstata participa en la produccin del semen. Se encuentra debajo de la vejiga y en frente del recto. Una prstata normal tiene el tamao de [place] y rodea el tubo que transporta la orina desde la vejiga (uretra). FACTORES DE RIESGO  Tener ms de 65aos.  Ser de Mining engineer.  La obesidad.  Tener antecedentes familiares de cncer de prstata.  Tener antecedentes familiares de cncer de mama.  SIGNOS Y SNTOMAS  Ganas frecuentes de orinar.  Flujo de orina dbil o que se interrumpe.  Dificultad para comenzar a Garment/textile technologist o Patent examiner.  Imposibilidad para orinar.  Dolor o ardor al Garment/textile technologist.  Eyaculacin dolorosa.  Sangre en la orina o el semen.  Dolor o Garment/textile technologist en la parte inferior de la espalda, la parte inferior del abdomen, la cadera o la parte superior de los muslos.  Dificultad para lograr una ereccin.  Dificultad para vaciar la vejiga por completo.  DIAGNSTICO Para diagnosticar el cncer de prstata, pueden hacerle un examen rectal digital, un anlisis de sangre para detectar el antgeno prosttico especfico, una ecografa transrectal y, luego, una biopsia para examinar Truddie Coco de tejido. En general, se toman entre 8y 32muestras. Estas se envan a un especialista que examina los tejidos y las clulas (patlogo). Si le  diagnostican cncer, el siguiente paso es determinar su estadio. Esto significa establecer su categora en relacin a la diseminacin del cncer. Es importante para que sus mdicos puedan planificar el tratamiento adecuado. Estos son los distintos estadios del cncer de prstata:  EstadioI. El cncer se encuentra solo en la prstata. No puede palparse mediante el examen rectal digital y no es visible en los estudios de diagnstico por imgenes. En general se descubre por accidente, por ejemplo, durante una ciruga por otros problemas en la prstata.  Estadio II. El cncer est ms avanzado que en el estadioI, pero no se ha diseminado fuera de la prstata.  Estadio III. El cncer se ha diseminado ms all de la [employer] tejidos circundantes. Tal vez haya cncer en las vesculas seminales.  Estadio IV. El cncer se ha diseminado a los ganglios linfticos o a otras partes del cuerpo. Tal vez haya cncer en la vejiga, el recto, los huesos, el hgado o los pulmones. A menudo, el cncer de prstata se propaga a los huesos. Para establecer el estadio, se [employer] de diagnstico por imgenes, como una gammagrafa sea, una tomografa computarizada, una tomografa por emisin de positrones o una resonancia magntica. TRATAMIENTO Es posible que le recomienden tratamientos como la [place], los medicamentos y la radiacin, segn el estadio del cncer y 31. Una vez que le diagnostiquen el cncer de prstata, su mdico analizar el tratamiento con usted. Su mdico lo ayudar a decidir cul es Producer, television/film/video. Generalmente los tratamientos dependen  de su edad, su salud y otros factores de Sales executive. Los mtodos de tratamiento ms frecuentes son:  Observacin en el caso del cncer de prstata en estadio temprano.  Ciruga abierta. Esta puede incluir la extraccin de la prstata.  Prostatectoma laparoscpica, para extirpar la prstata y los ganglios linfticos.  Prostatectoma  robtica, para extirpar la prstata y los ganglios linfticos.  Radiacin con rayo externo, que aplica radiacin a la prstata desde afuera del cuerpo.  Radiacin interna (braquiterapia), que utiliza agujas radioactivas, entre40 y 100grnulos (semillas), cables o catteres que se implantan directamente en la prstata.  Ultrasonografa focalizada de alta intensidad, para destruir las clulas cancerosas.  Criociruga, para congelar y destruir las clulas cancerosas de la prstata.  Quimioterapia, para detener el crecimiento de las clulas cancerosas, ya sea destruyndolas o impidiendo su multiplicacin.  Tratamiento hormonal. Medicamentos para que el organismo deje de producir testosterona o que impiden que la testosterona llegue a las clulas cancerosas.  Orquiectoma. Esta es una ciruga para extirpar los testculos. [place] los medicamentos solamente como se lo haya indicado el mdico.  Mantenga una dieta saludable.  Duerma lo suficiente.  Considere participar en un grupo de apoyo. Esto puede ayudarlo a sobrellevar el estrs que implica sufrir cncer de prstata.  Busque asesoramiento que lo ayude a [employer] secundarios del [place].  Concurra a todas las visitas de control como se lo haya indicado el mdico.  Informe al onclogo si lo internan en el hospital.  Siga manteniendo relaciones sexuales. Considere tocar, abrazar y acariciar a su pareja para continuar compartiendo su sexualidad.  SOLICITE ATENCIN MDICA SI:  Tiene problemas para orinar.  Observa sangre en la orina.  No logra tener una ereccin.  Siente dolor en la cadera, la espalda o el pecho.  SOLICITE ATENCIN MDICA DE INMEDIATO SI:  Siente debilidad o adormecimiento en las piernas.  Tiene una prdida involuntaria de [place] o de heces (incontinencia).  Esta informacin no tiene Marine scientist el consejo del mdico. Asegrese de hacerle al  mdico cualquier pregunta que tenga. Document Released: 12/26/2004 Document Revised: 04/19/2015 Document Reviewed: 07/02/2015 Elsevier Interactive Patient Education  2017 [employer].

## 2017-12-13 ENCOUNTER — Other Ambulatory Visit: Payer: Self-pay

## 2017-12-13 ENCOUNTER — Encounter: Payer: Self-pay | Admitting: Radiation Oncology

## 2017-12-13 ENCOUNTER — Ambulatory Visit
Admission: RE | Admit: 2017-12-13 | Discharge: 2017-12-13 | Disposition: A | Discharge status: Home / Self Care | Payer: 59 | Source: Ambulatory Visit | Attending: Radiation Oncology | Admitting: Radiation Oncology

## 2017-12-13 VITALS — BP 165/95 | HR 90 | Temp 96.6°F | Resp 18 | Wt 169.4 lb

## 2017-12-13 DIAGNOSIS — R351 Nocturia: Secondary | ICD-10-CM | POA: Insufficient documentation

## 2017-12-13 DIAGNOSIS — Z9049 Acquired absence of other specified parts of digestive tract: Secondary | ICD-10-CM | POA: Diagnosis not present

## 2017-12-13 DIAGNOSIS — Z9081 Acquired absence of spleen: Secondary | ICD-10-CM | POA: Insufficient documentation

## 2017-12-13 DIAGNOSIS — Z79899 Other long term (current) drug therapy: Secondary | ICD-10-CM | POA: Insufficient documentation

## 2017-12-13 DIAGNOSIS — Z87891 Personal history of nicotine dependence: Secondary | ICD-10-CM | POA: Diagnosis not present

## 2017-12-13 DIAGNOSIS — C61 Malignant neoplasm of prostate: Secondary | ICD-10-CM | POA: Diagnosis present

## 2017-12-13 NOTE — Consult Note (Signed)
NEW PATIENT EVALUATION  Name: Anthony Castaneda  MRN: [id number]  Date:   12/13/2017     DOB: 04/24/1950   This 67 y.o. 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 Breast Cancer    Pt is here for initial consultation of prostate cancer    DIAGNOSIS: The encounter diagnosis was Malignant neoplasm of prostate ([place]).   PREVIOUS INVESTIGATIONS:  Pathology reports reviewed CT scan from 2018 of abdomen and pelvis reviewed Clinical notes reviewed   HPI: patient is a Spanish speaking 67 year old malewho presented with an elevated PSA of 7.3. He was referred to urology was noted to have a 49 cc prostate and underwent transrectal ultrasound-guided biopsy. He had 7 of 12 cores positive mostly Gleason 7 (3+4) although the was 1 core showing Gleason 4+3). Patient has had multiple surgeries in the past including's open splenectomy as well as lap is A cholecystectomy. He is very little lower urinary tract symptoms specifically denies urgency frequency. Has nocturia 2 no erectile dysfunction. He's been seen by urology based on his multiple previous surgeries he is thought to be a good candidate for radiation therapy. He is seen today for opinion.patient is concerned about missing substantial work.  PLANNED TREATMENT REGIMEN: I-125 interstitial implant  PAST MEDICAL HISTORY:  has a past medical history of Hemolytic anemia (HCC) and Nephrolithiasis.    PAST SURGICAL HISTORY:  Past Surgical History:  Procedure Laterality Date  . APPENDECTOMY    . CHOLECYSTECTOMY    . COLONOSCOPY WITH PROPOFOL N/A 09/27/2015   Procedure: COLONOSCOPY WITH PROPOFOL;  Surgeon: Manya Silvas, MD;  [facility] ENDOSCOPY;  Service: Endoscopy;  Laterality: N/A;  . SPLENECTOMY      FAMILY HISTORY:  family history is not on file.  SOCIAL HISTORY:  reports that he has quit smoking. He has never used smokeless tobacco. He reports that he does not drink alcohol or use drugs.  ALLERGIES: Patient has no known allergies.  MEDICATIONS:  Current Outpatient Medications  Medication Sig Dispense Refill  . lisinopril (PRINIVIL,ZESTRIL) 10 MG tablet Take 10 mg by mouth daily.    Marland Kitchen HYDROcodone-acetaminophen (NORCO/VICODIN) 5-325 MG tablet Take 1 tablet by mouth every 6 (six) hours as needed for moderate pain. (Patient not taking: Reported on 11/20/2017) 12 tablet 0  . ondansetron (ZOFRAN ODT) 4 MG disintegrating tablet Take 1 tablet (4 mg total) by mouth every 6 (six) hours as needed for nausea or vomiting. (Patient not taking: Reported on 11/20/2017) 20 tablet 0  . tamsulosin (FLOMAX) 0.4 MG CAPS capsule Take 1 capsule (0.4 mg total) by mouth daily. (Patient not taking: Reported on 11/20/2017) 7 capsule 0   No current facility-administered medications for this encounter.     ECOG PERFORMANCE STATUS:  0 - Asymptomatic  REVIEW OF SYSTEMS:  Patient denies any weight loss, fatigue, weakness, fever, chills or night sweats. Patient denies any loss of vision, blurred vision. Patient denies any ringing  of the ears or hearing loss. No irregular heartbeat. Patient denies heart murmur or history of fainting. Patient denies any chest pain or pain radiating to her upper extremities. Patient denies any shortness of breath, difficulty breathing at night, cough or hemoptysis. Patient denies any swelling in the lower legs. Patient denies any nausea vomiting, vomiting of blood, or coffee ground material  in the vomitus. Patient denies any stomach pain. Patient states has had normal bowel movements no significant constipation or diarrhea. Patient denies any dysuria, hematuria or significant nocturia. Patient denies any problems walking, swelling in the joints or loss of balance. Patient denies any skin changes, loss of  hair or loss of weight. Patient denies any excessive worrying or anxiety or significant depression. Patient denies any problems with insomnia. Patient denies excessive thirst, polyuria, polydipsia. Patient denies any swollen glands, patient denies easy bruising or easy bleeding. Patient denies any recent infections, allergies or URI. Patient "s visual fields have not changed significantly in recent time.    PHYSICAL EXAM: BP (!) 165/95 (BP Location: Left Arm, Patient Position: Sitting)   Pulse 90   Temp (!) 96.6 F (35.9 C) (Tympanic)   Resp 18   Wt 169 lb 6.8 oz (76.9 kg)   BMI 28.19 kg/m  On rectal exam rectal sphincter tone is good prostate is smooth without evidence of nodularity or mass. Sulcus is preserved bilaterally. No other rectal abnormalities are identified.Well-developed well-nourished patient in NAD. HEENT reveals PERLA, EOMI, discs not visualized.  Oral cavity is clear. No oral mucosal lesions are identified. Neck is clear without evidence of cervical or supraclavicular adenopathy. Lungs are clear to A&P. Cardiac examination is essentially unremarkable with regular rate and rhythm without murmur rub or thrill. Abdomen is benign with no organomegaly or masses noted. Motor sensory and DTR levels are equal and symmetric in the upper and lower extremities. Cranial nerves II through XII are grossly intact. Proprioception is intact. No peripheral adenopathy or edema is identified. No motor or sensory levels are noted. Crude visual fields are within normal range.  LABORATORY DATA: pathology reports reviewed    RADIOLOGY RESULTS:CT scan from 2018 for renal stone reviewed   IMPRESSION: stage IIb adenocarcinoma the prostate mostly Gleason 7 (3+4) presenting with a PSA of 74.109 in 67 year old male  PLAN: at this time I believe the best treatment option for the patient would be I-125 interstitial implant. I have run the [facility] nomogram on the patient he has a 65% chance of  extracapsular extension only a 5% chance of lymph node involvement and 6% chance of seminal vesicle invasion. I would plan for I-125 interstitial implant and will arrange for volume study along with urology. Risks and benefits of treatment including radiation safety precautions were reviewed with the patient and his family. Increased lower urinary tract symptoms fatigue alteration of blood counts and risks of general anesthesia all reviewed with the patient. He seems to comprehend our treatment plan well. We will set up via and study in the near future with urology and notify the patient.  I would like to take this opportunity to thank you for allowing me to participate in the care of your patient.Noreene Filbert, MD

## 2017-12-26 ENCOUNTER — Encounter: Payer: Self-pay | Admitting: Radiation Therapy

## 2017-12-26 ENCOUNTER — Encounter: Payer: Self-pay | Admitting: *Deleted

## 2017-12-27 ENCOUNTER — Other Ambulatory Visit: Payer: Self-pay | Admitting: Radiology

## 2017-12-27 DIAGNOSIS — C61 Malignant neoplasm of prostate: Secondary | ICD-10-CM

## 2018-01-04 ENCOUNTER — Telehealth: Payer: Self-pay | Admitting: Radiology

## 2018-01-04 ENCOUNTER — Other Ambulatory Visit: Payer: Self-pay | Admitting: Radiology

## 2018-01-04 NOTE — Telephone Encounter (Signed)
Discussed the [facility] Information form below with patient over the phone using interpreter [place] [phone].   [place], [place], [place] [address] Telephone: [phone] Fax: [phone]   Thank you for choosing [facility] for your upcoming surgery!  We are always here to assist in your urological needs.  Please read the following information with specific details for your upcoming appointments related to your surgery. Please contact Jerrianne Hartin at [phone] Option 3 with any questions.  The Name of Your Surgery: Prostate Volume Study with Prostate seed implants Your Surgery Date: 01/28/2018 & 02/25/2018 Your Surgeon: Hollice Espy  Please call Same Day Surgery at [phone] between the hours of 1pm-3pm one day prior to your surgery. They will inform you of the time to arrive at Same Day Surgery which is located on the second floor of the [facility].   Your pre-admission testing appointment is 02/12/2018 at 8:00.  The Pre-Admission Testing office is located at [place], on the first floor of the [place] [facility] in [place] (office is to the right as you enter through the [employer] of the [facility]). Please have all medications you are currently taking and your insurance card available.   Patient was advised to have nothing to eat or drink after midnight the night prior to surgery except that he may have only water until 2 hours before surgery with nothing to drink within 2 hours of surgery.  The patient states he currently takes no blood thinners. Patient's questions were answered and he expressed understanding of these instructions.

## 2018-01-07 NOTE — Telephone Encounter (Signed)
Patient requested pre-admission testing appointment be rescheduled. Notified patient of new appointment 02/15/2018 at 1:00. Patient expresses understanding.

## 2018-01-28 ENCOUNTER — Ambulatory Visit
Admission: RE | Admit: 2018-01-28 | Discharge: 2018-01-28 | Disposition: A | Discharge status: Home / Self Care | Payer: 59 | Source: Ambulatory Visit | Attending: Radiation Oncology | Admitting: Radiation Oncology

## 2018-01-28 ENCOUNTER — Encounter: Payer: Self-pay | Admitting: Radiation Oncology

## 2018-01-28 ENCOUNTER — Other Ambulatory Visit: Payer: Self-pay

## 2018-01-28 ENCOUNTER — Ambulatory Visit: Admission: RE | Admit: 2018-01-28 | Payer: 59 | Source: Home / Self Care | Admitting: Urology

## 2018-01-28 VITALS — BP 161/94 | HR 71 | Temp 97.0°F | Resp 18 | Wt 166.0 lb

## 2018-01-28 DIAGNOSIS — C61 Malignant neoplasm of prostate: Secondary | ICD-10-CM | POA: Insufficient documentation

## 2018-01-28 DIAGNOSIS — Z79899 Other long term (current) drug therapy: Secondary | ICD-10-CM | POA: Insufficient documentation

## 2018-01-28 DIAGNOSIS — Z87891 Personal history of nicotine dependence: Secondary | ICD-10-CM | POA: Diagnosis not present

## 2018-01-28 SURGERY — ULTRASOUND, PROSTATE, FOR VOLUME DETERMINATION
Anesthesia: Choice

## 2018-01-28 NOTE — H&P (View-Only) (Signed)
NEW PATIENT EVALUATION  Name: Anthony Castaneda  MRN: [id number]  Date:   01/28/2018     DOB: March 07, 1950   This 68 y.o. male patient presents to the clinic for initial evaluation of preop history and physical in anticipation I-125 interstitial implant for prostate cancer.  REFERRING PHYSICIAN: Theotis Burrow*  CHIEF COMPLAINT:  Chief Complaint  Patient presents with  . Prostate Cancer    Post volume study    DIAGNOSIS: The encounter diagnosis was Malignant neoplasm of prostate ([place]).   PREVIOUS INVESTIGATIONS:  Previous notes reviewed  HPI: patient is a 68 year old male Spanish-speaking accompanied by interpreter who presented with a PSA of 7.3. 712 cores were positive for mostly Gleason 7 (3+4) adenocarcinoma. Patient is fairly asymptomatic with very low frequency urgency. He is now scheduled for I-125 interstitial implant seen today for volume study and history and physical in preparation of surgery. He continues to do well with very little side effect profile.  PLANNED TREATMENT REGIMEN: I-125 interstitial implant  PAST MEDICAL HISTORY:  has a past medical history of Hemolytic anemia (HCC) and Nephrolithiasis.    PAST SURGICAL HISTORY:  Past Surgical History:  Procedure Laterality Date  . APPENDECTOMY    . CHOLECYSTECTOMY    . COLONOSCOPY WITH PROPOFOL N/A 09/27/2015   Procedure: COLONOSCOPY WITH PROPOFOL;  Surgeon: Manya Silvas, MD;  Location[facility] ENDOSCOPY;  Service: Endoscopy;  Laterality: N/A;  . SPLENECTOMY      FAMILY HISTORY: family history is not on file.  SOCIAL HISTORY:  reports that he has quit smoking. He has never used smokeless tobacco. He reports that he does not drink alcohol or use drugs.  ALLERGIES: Patient has no known allergies.  MEDICATIONS:  Current Outpatient Medications  Medication Sig Dispense Refill  . lisinopril (PRINIVIL,ZESTRIL) 10 MG tablet Take 10 mg by mouth daily.    Marland Kitchen HYDROcodone-acetaminophen (NORCO/VICODIN) 5-325 MG  tablet Take 1 tablet by mouth every 6 (six) hours as needed for moderate pain. (Patient not taking: Reported on 11/20/2017) 12 tablet 0  . ondansetron (ZOFRAN ODT) 4 MG disintegrating tablet Take 1 tablet (4 mg total) by mouth every 6 (six) hours as needed for nausea or vomiting. (Patient not taking: Reported on 11/20/2017) 20 tablet 0  . tamsulosin (FLOMAX) 0.4 MG CAPS capsule Take 1 capsule (0.4 mg total) by mouth daily. (Patient not taking: Reported on 11/20/2017) 7 capsule 0   No current facility-administered medications for this encounter.     ECOG PERFORMANCE STATUS:  0 - Asymptomatic  REVIEW OF SYSTEMS:  Patient denies any weight loss, fatigue, weakness, fever, chills or night sweats. Patient denies any loss of vision, blurred vision. Patient denies any ringing  of the ears or hearing loss. No irregular heartbeat. Patient denies heart murmur or history of fainting. Patient denies any chest pain or pain radiating to her upper extremities. Patient denies any shortness of breath, difficulty breathing at night, cough or hemoptysis. Patient denies any swelling in the lower legs. Patient denies any nausea vomiting, vomiting of blood, or coffee ground material in the vomitus. Patient denies any stomach pain. Patient states has had normal bowel movements no significant constipation or diarrhea. Patient denies any dysuria, hematuria or significant nocturia. Patient denies any problems walking, swelling in the joints or loss of balance. Patient denies any skin changes, loss of hair or loss of weight. Patient denies any excessive worrying or anxiety or significant depression. Patient denies any problems with insomnia. Patient denies excessive thirst, polyuria, polydipsia. Patient denies any  swollen glands, patient denies easy bruising or easy bleeding. Patient denies any recent infections, allergies or URI. Patient "s visual fields have not changed significantly in recent time.    PHYSICAL EXAM: BP (!)  161/94 (BP Location: Left Arm, Patient Position: Sitting)   Pulse 71   Temp (!) 97 F (36.1 C) (Tympanic)   Resp 18   Wt 166 lb 0.1 oz (75.3 kg)   BMI 27.62 kg/m  Well-developed well-nourished patient in NAD. HEENT reveals PERLA, EOMI, discs not visualized.  Oral cavity is clear. No oral mucosal lesions are identified. Neck is clear without evidence of cervical or supraclavicular adenopathy. Lungs are clear to A&P. Cardiac examination is essentially unremarkable with regular rate and rhythm without murmur rub or thrill. Abdomen is benign with no organomegaly or masses noted. Motor sensory and DTR levels are equal and symmetric in the upper and lower extremities. Cranial nerves II through XII are grossly intact. Proprioception is intact. No peripheral adenopathy or edema is identified. No motor or sensory levels are noted. Crude visual fields are within normal range.  LABORATORY DATA: pathology reports reviewed    RADIOLOGY RESULTS:films for review   IMPRESSION: stage IIB (T1 CN 0 M0) Gleason 7 (3+4) adenocarcinoma the prostate for I-125 interstitial implant in 68 year old male  PLAN: this time patient will be taken to the OR for volume study anticipation of I-125 interstitial implant from our standpoint he is cleared for surgery for placement of I-125 seeds in his prostate. Risks and benefits of treatment including increased lower urinary tract symptoms fatigue and radiation safety precautions all were reviewed with the patient and his wife through an interpreter. Patient copies her treatment plan well.  I would like to take this opportunity to thank you for allowing me to participate in the care of your patient.Noreene Filbert, MD

## 2018-01-28 NOTE — Progress Notes (Signed)
Radiation Oncology Follow up Note  Name: Anthony Castaneda   Date:   12/27/2017 MRN:  [id number] DOB: 04-21-1950    This 68 y.o. male presents to [facility] today for volume study in anticipation of I-125 interstitial implant in patient with known stage IIB adenocarcinoma the prostate  REFERRING PROVIDER: No ref. provider found  HPI: atient is seen today for volume study in anticipation of placement of I-125 seeds for his stage IIB adenocarcinoma the prostate..  COMPLICATIONS OF TREATMENT: none  FOLLOW UP COMPLIANCE: keeps appointments   PHYSICAL EXAM:  There were no vitals taken for this visit. Well-developed well-nourished patient in NAD. HEENT reveals PERLA, EOMI, discs not visualized.  Oral cavity is clear. No oral mucosal lesions are identified. Neck is clear without evidence of cervical or supraclavicular adenopathy. Lungs are clear to A&P. Cardiac examination is essentially unremarkable with regular rate and rhythm without murmur rub or thrill. Abdomen is benign with no organomegaly or masses noted. Motor sensory and DTR levels are equal and symmetric in the upper and lower extremities. Cranial nerves II through XII are grossly intact. Proprioception is intact. No peripheral adenopathy or edema is identified. No motor or sensory levels are noted. Crude visual fields are within normal range.  RADIOLOGY RESULTS: ultrasound used for volume study  PLAN: Patient was taken to the cystoscopy suite in the OR. Patient was placed in the low lithotomy position. Foley catheter was placed. Trans-rectal ultrasound probe was inserted into the rectum and prostate seminal vesicles were visualized as well as bladder base. stepping images were performed on a 5 mm increments. Images will be placed in [employer] treatment planning system to determine seed placement coordinates for eventual I-125 interstitial implant. Images will be reviewed with the physics and dosimetry staff for final quality approval. I  personally was present for the volume study and assisted in delineation of contour volumes.  At the end of the procedure Foley catheter was removed, rectal ultrasound probe was removed. Patient tolerated his procedures extremely well with no side effects or complaints. Patient has given appointment for interstitial implant date. Consent was signed today as well as history and physical performed in preparation for his outpatient surgical implant.      Noreene Filbert, MD

## 2018-01-28 NOTE — H&P (Signed)
NEW PATIENT EVALUATION  Name: Anthony Castaneda  MRN: [id number]  Date:   01/28/2018     DOB: [birth year]   This 68 y.o. male patient presents to the clinic for initial evaluation of preop history and physical in anticipation I-125 interstitial implant for prostate cancer.  REFERRING PHYSICIAN: Theotis Burrow*  CHIEF COMPLAINT:  Chief Complaint  Patient presents with  . Prostate Cancer    Post volume study    DIAGNOSIS: The encounter diagnosis was Malignant neoplasm of prostate ([place]).   PREVIOUS INVESTIGATIONS:  Previous notes reviewed  HPI: patient is a 68 year old male Spanish-speaking accompanied by interpreter who presented with a PSA of 7.3. 712 cores were positive for mostly Gleason 7 (3+4) adenocarcinoma. Patient is fairly asymptomatic with very low frequency urgency. He is now scheduled for I-125 interstitial implant seen today for volume study and history and physical in preparation of surgery. He continues to do well with very little side effect profile.  PLANNED TREATMENT REGIMEN: I-125 interstitial implant  PAST MEDICAL HISTORY:  has a past medical history of Hemolytic anemia (HCC) and Nephrolithiasis.    PAST SURGICAL HISTORY:  Past Surgical History:  Procedure Laterality Date  . APPENDECTOMY    . CHOLECYSTECTOMY    . COLONOSCOPY WITH PROPOFOL N/A 09/27/2015   Procedure: COLONOSCOPY WITH PROPOFOL;  Surgeon: Manya Silvas, MD;  Location: [facility] ENDOSCOPY;  Service: Endoscopy;  Laterality: N/A;  . SPLENECTOMY      FAMILY HISTORY: family history is not on file.  SOCIAL HISTORY:  reports that he has quit smoking. He has never used smokeless tobacco. He reports that he does not drink alcohol or use drugs.  ALLERGIES: Patient has no known allergies.  MEDICATIONS:  Current Outpatient Medications  Medication Sig Dispense Refill  . lisinopril (PRINIVIL,ZESTRIL) 10 MG tablet Take 10 mg by mouth daily.    Marland Kitchen HYDROcodone-acetaminophen (NORCO/VICODIN) 5-325 MG  tablet Take 1 tablet by mouth every 6 (six) hours as needed for moderate pain. (Patient not taking: Reported on 11/20/2017) 12 tablet 0  . ondansetron (ZOFRAN ODT) 4 MG disintegrating tablet Take 1 tablet (4 mg total) by mouth every 6 (six) hours as needed for nausea or vomiting. (Patient not taking: Reported on 11/20/2017) 20 tablet 0  . tamsulosin (FLOMAX) 0.4 MG CAPS capsule Take 1 capsule (0.4 mg total) by mouth daily. (Patient not taking: Reported on 11/20/2017) 7 capsule 0   No current facility-administered medications for this encounter.     ECOG PERFORMANCE STATUS:  0 - Asymptomatic  REVIEW OF SYSTEMS:  Patient denies any weight loss, fatigue, weakness, fever, chills or night sweats. Patient denies any loss of vision, blurred vision. Patient denies any ringing  of the ears or hearing loss. No irregular heartbeat. Patient denies heart murmur or history of fainting. Patient denies any chest pain or pain radiating to her upper extremities. Patient denies any shortness of breath, difficulty breathing at night, cough or hemoptysis. Patient denies any swelling in the lower legs. Patient denies any nausea vomiting, vomiting of blood, or coffee ground material in the vomitus. Patient denies any stomach pain. Patient states has had normal bowel movements no significant constipation or diarrhea. Patient denies any dysuria, hematuria or significant nocturia. Patient denies any problems walking, swelling in the joints or loss of balance. Patient denies any skin changes, loss of hair or loss of weight. Patient denies any excessive worrying or anxiety or significant depression. Patient denies any problems with insomnia. Patient denies excessive thirst, polyuria, polydipsia. Patient denies any  swollen glands, patient denies easy bruising or easy bleeding. Patient denies any recent infections, allergies or URI. Patient "s visual fields have not changed significantly in recent time.    PHYSICAL EXAM: BP (!)  161/94 (BP Location: Left Arm, Patient Position: Sitting)   Pulse 71   Temp (!) 97 F (36.1 C) (Tympanic)   Resp 18   Wt 166 lb 0.1 oz (75.3 kg)   BMI 27.62 kg/m  Well-developed well-nourished patient in NAD. HEENT reveals PERLA, EOMI, discs not visualized.  Oral cavity is clear. No oral mucosal lesions are identified. Neck is clear without evidence of cervical or supraclavicular adenopathy. Lungs are clear to A&P. Cardiac examination is essentially unremarkable with regular rate and rhythm without murmur rub or thrill. Abdomen is benign with no organomegaly or masses noted. Motor sensory and DTR levels are equal and symmetric in the upper and lower extremities. Cranial nerves II through XII are grossly intact. Proprioception is intact. No peripheral adenopathy or edema is identified. No motor or sensory levels are noted. Crude visual fields are within normal range.  LABORATORY DATA: pathology reports reviewed    RADIOLOGY RESULTS:films for review   IMPRESSION: stage IIB (T1 CN 0 M0) Gleason 7 (3+4) adenocarcinoma the prostate for I-125 interstitial implant in 68 year old male  PLAN: this time patient will be taken to the OR for volume study anticipation of I-125 interstitial implant from our standpoint he is cleared for surgery for placement of I-125 seeds in his prostate. Risks and benefits of treatment including increased lower urinary tract symptoms fatigue and radiation safety precautions all were reviewed with the patient and his wife through an interpreter. Patient copies her treatment plan well.  I would like to take this opportunity to thank you for allowing me to participate in the care of your patient.Noreene Filbert, MD

## 2018-02-04 ENCOUNTER — Ambulatory Visit
Admission: RE | Admit: 2018-02-04 | Discharge: 2018-02-04 | Disposition: A | Discharge status: Home / Self Care | Payer: 59 | Source: Ambulatory Visit | Attending: Radiation Oncology | Admitting: Radiation Oncology

## 2018-02-04 DIAGNOSIS — Z510 Encounter for antineoplastic radiation therapy: Secondary | ICD-10-CM | POA: Diagnosis not present

## 2018-02-04 DIAGNOSIS — C61 Malignant neoplasm of prostate: Secondary | ICD-10-CM | POA: Insufficient documentation

## 2018-02-12 ENCOUNTER — Other Ambulatory Visit: Payer: 59

## 2018-02-15 ENCOUNTER — Encounter
Admission: RE | Admit: 2018-02-15 | Discharge: 2018-02-15 | Disposition: A | Discharge status: Home / Self Care | Payer: 59 | Source: Ambulatory Visit | Attending: Urology | Admitting: Urology

## 2018-02-15 ENCOUNTER — Other Ambulatory Visit: Payer: Self-pay

## 2018-02-15 DIAGNOSIS — Z01818 Encounter for other preprocedural examination: Secondary | ICD-10-CM | POA: Insufficient documentation

## 2018-02-15 DIAGNOSIS — R001 Bradycardia, unspecified: Secondary | ICD-10-CM | POA: Diagnosis not present

## 2018-02-15 DIAGNOSIS — I10 Essential (primary) hypertension: Secondary | ICD-10-CM | POA: Diagnosis not present

## 2018-02-15 DIAGNOSIS — Z01810 Encounter for preprocedural cardiovascular examination: Secondary | ICD-10-CM

## 2018-02-15 HISTORY — DX: Essential (primary) hypertension: I10

## 2018-02-15 LAB — CBC
HCT: 48.3 % (ref 39.0–52.0)
Hemoglobin: 15.9 g/dL (ref 13.0–17.0)
MCH: 30.5 pg (ref 26.0–34.0)
MCHC: 32.9 g/dL (ref 30.0–36.0)
MCV: 92.7 fL (ref 80.0–100.0)
Platelets: 339 10*3/uL (ref 150–400)
RBC: 5.21 MIL/uL (ref 4.22–5.81)
RDW: 14.6 % (ref 11.5–15.5)
WBC: 7.5 10*3/uL (ref 4.0–10.5)
nRBC: 0 % (ref 0.0–0.2)

## 2018-02-15 NOTE — Patient Instructions (Signed)
Your procedure is scheduled on: February 25, 2018 [place]  Su procedimiento est programado para: Report to  [place] a: [employer] To find out your arrival time please call [phone] between [place] on Para saber su hora de llegada por favor llame al ([place]: February 22, 2018    Remember: Instructions that are not followed completely may result in serious medical risk, up to and including death,  or upon the discretion of your surgeon and anesthesiologist your surgery may need to be rescheduled.  Recuerde: Las instrucciones que no se siguen completamente Heritage manager en un riesgo de salud grave, incluyendo hasta  la [place] o a discrecin de su cirujano y Environmental health practitioner, su ciruga se puede posponer.   __X_ 1.Do not eat food after midnight the night before your procedure. No    gum chewing or hard candies. You may drink clear liquids up to 2 hours     before you are scheduled to arrive for your surgery- DO not drink clear     Liquids within 2 hours of the start of your surgery.     Clear Liquids include:    water, apple juice without pulp, clear carbohydrate drink such as    Clearfast of [place]art[place]ade, Black Coffee or Tea (Do not add anything to coffee or tea).      No coma nada despus de la medianoche de la noche anterior a su    procedimiento. No coma chicles ni caramelos duros. Puede tomar    lquidos claros hasta 2 horas antes de su hora programada de llegada al     hospital para su procedimiento. No tome lquidos claros durante el     transcurso de las 2 horas de su llegada programada al hospital para su     procedimiento, ya que esto puede llevar a que su procedimiento se    retrase o tenga que volver a Health and safety inspector.  Los lquidos claros incluyen:          - Agua o jugo de [place] sin pulpa          - Bebidas claras con carbohidratos como ClearFast o Gatorade          - Caf negro o t claro (sin leche, sin cremas, no agregue  nada al caf ni al t)  No tome nada que no est en esta lista.  Los pacientes con diabetes tipo 1 y tipo 2 solo deben Agricultural engineer.  Llame a la clnica de PreCare o a la unidad de Same Day Surgery si  tiene alguna pregunta sobre estas instrucciones.              _X__ 2.Do Not Smoke or use e-cigarettes For 24 Hours Prior to Your Surgery.    Do not use any chewable tobacco products for at least 6   hours prior to surgery.    No fume ni use cigarrillos electrnicos durante las 24 horas previas    a su [place].  No use ningn producto de tabaco masticable durante   al menos 6 horas antes de la [place].     __X_ 3. No alcohol for 24 hours before or after surgery.    No tome alcohol durante las 24 horas antes ni despus de la [place].   ____4. Bring all medications with you on the day of surgery if instructed.    Lleve todos los medicamentos con usted el da de su ciruga si se le  ha indicado as.   ____ 5. Notify your doctor if there is any change in your medical condition (cold,fever, infections).    Informe a su mdico si hay algn cambio en su condicin mdica  (resfriado, fiebre, infecciones).   Do not wear jewelry, make-up, hairpins, clips or nail polish.  No use joyas, maquillajes, pinzas/ganchos para el cabello ni esmalte de uas.  Do not wear lotions, powders, or perfumes. You may wear deodorant.  No use lociones, polvos o perfumes.  Puede usar desodorante.    Do not shave 48 hours prior to surgery. Men may shave face and neck.  No se afeite 48 horas antes de la [place].  Los hombres pueden [employer] cara  y el cuello.   Do not bring valuables to the hospital.   No lleve objetos [place] is not responsible for any belongings or valuables.  [place] no se hace responsable de ningn tipo de pertenencias u objetos de Geographical information systems officer.               Contacts, dentures or bridgework may not be worn into surgery.  Los lentes de [place], las dentaduras  postizas o puentes no se pueden usar en la [place].     For patients admitted to the hospital, discharge time is determined by your  treatment team.  Para los pacientes que sean ingresados al hospital, el tiempo en el cual se le  dar de alta es determinado por su equipo de [place].   Patients discharged the day of surgery will not be allowed to drive home. A los pacientes que se les da de alta el mismo da de la ciruga no se les permitir conducir a Holiday representative.   Please read over the following fact sheets that you were given: Por favor [place] informacin que le dieron:      __X__ Take these medicines the morning of surgery with A SIP OF WATER:          [employer] estas medicinas la maana de la ciruga con [place]:  1.nada  2.   3.   4.       5.  6.  __X__ Fleet Enema (as directed)          Enema de Fleet (segn lo indicado)    ____ Use CHG Soap as directed          Utilice el jabn de CHG segn lo indicado  ____ Use inhalers on the day of surgery          Use los inhaladores el da de la ciruga  ____ Stop metformin 2 days prior to surgery          Deje de tomar el metformin 2 das antes de la ciruga    ____ Take 1/2 of usual insulin dose the night before surgery and none on the morning of surgery           Tome la mitad de la dosis habitual de insulina la noche antes de la [place] y no tome nada en la maana de la             ciruga  ____ Stop Coumadin/Plavix/aspirin on           Deje de tomar el Coumadin/Plavix/aspirina el da:  ___X_ Stop Anti-inflammatories on           Deje de tomar antiinflamatorios el da: ADVIL, IBUPROFEN, MOTRIN, ALEVE, NAPROXYN, BC POWDER, GOODYS,  EXCEDRINE  TYLENOL - SOLO ____ Stop supplements until after surgery            Deje de tomar suplementos hasta despus de la ciruga  ____ Bring C-Pap to the hospital          [place] al hospital

## 2018-02-24 MED ORDER — CIPROFLOXACIN IN D5W 400 MG/200ML IV SOLN
400.0000 mg | INTRAVENOUS | Status: AC
  Administered 2018-02-25: 400 mg via INTRAVENOUS

## 2018-02-25 ENCOUNTER — Ambulatory Visit
Admission: RE | Admit: 2018-02-25 | Discharge: 2018-02-25 | Disposition: A | Discharge status: Home / Self Care | Payer: 59 | Source: Ambulatory Visit | Attending: Radiation Oncology | Admitting: Radiation Oncology

## 2018-02-25 ENCOUNTER — Encounter
Admission: RE | Disposition: A | Discharge status: Home / Self Care | Payer: Self-pay | Source: Ambulatory Visit | Attending: Urology

## 2018-02-25 ENCOUNTER — Other Ambulatory Visit: Payer: Self-pay

## 2018-02-25 ENCOUNTER — Ambulatory Visit: Payer: 59 | Admitting: Anesthesiology

## 2018-02-25 ENCOUNTER — Encounter: Payer: Self-pay | Admitting: *Deleted

## 2018-02-25 ENCOUNTER — Ambulatory Visit
Admission: RE | Admit: 2018-02-25 | Discharge: 2018-02-25 | Disposition: A | Discharge status: Home / Self Care | Payer: 59 | Source: Ambulatory Visit | Attending: Urology | Admitting: Urology

## 2018-02-25 DIAGNOSIS — C61 Malignant neoplasm of prostate: Secondary | ICD-10-CM | POA: Diagnosis present

## 2018-02-25 DIAGNOSIS — Z87891 Personal history of nicotine dependence: Secondary | ICD-10-CM | POA: Diagnosis not present

## 2018-02-25 DIAGNOSIS — Z79899 Other long term (current) drug therapy: Secondary | ICD-10-CM | POA: Diagnosis not present

## 2018-02-25 DIAGNOSIS — I10 Essential (primary) hypertension: Secondary | ICD-10-CM | POA: Diagnosis not present

## 2018-02-25 HISTORY — PX: RADIOACTIVE SEED IMPLANT: SHX5150

## 2018-02-25 SURGERY — INSERTION, RADIATION SOURCE, PROSTATE
Anesthesia: General | Site: Prostate

## 2018-02-25 MED ORDER — PROMETHAZINE HCL 25 MG/ML IJ SOLN: 6.2500 mg | INTRAMUSCULAR | Status: DC | PRN

## 2018-02-25 MED ORDER — SUGAMMADEX SODIUM 200 MG/2ML IV SOLN
INTRAVENOUS | Status: DC | PRN
  Administered 2018-02-25: 160 mg via INTRAVENOUS

## 2018-02-25 MED ORDER — SUCCINYLCHOLINE CHLORIDE 20 MG/ML IJ SOLN
INTRAMUSCULAR | Status: DC | PRN
  Administered 2018-02-25: 100 mg via INTRAVENOUS

## 2018-02-25 MED ORDER — TAMSULOSIN HCL 0.4 MG PO CAPS: 0.4000 mg | ORAL_CAPSULE | Freq: Every day | ORAL | 0 refills | Status: DC

## 2018-02-25 MED ORDER — BACITRACIN 500 UNIT/GM EX OINT
TOPICAL_OINTMENT | CUTANEOUS | Status: DC | PRN
  Administered 2018-02-25: 1 via TOPICAL

## 2018-02-25 MED ORDER — MEPERIDINE HCL 50 MG/ML IJ SOLN: 6.2500 mg | INTRAMUSCULAR | Status: DC | PRN

## 2018-02-25 MED ORDER — LACTATED RINGERS IV SOLN
INTRAVENOUS | Status: DC
  Administered 2018-02-25: 08:00:00 via INTRAVENOUS

## 2018-02-25 MED ORDER — LIDOCAINE HCL (PF) 2 % IJ SOLN
INTRAMUSCULAR | Status: AC
  Filled 2018-02-25: qty 10

## 2018-02-25 MED ORDER — PROPOFOL 10 MG/ML IV BOLUS
INTRAVENOUS | Status: AC
  Filled 2018-02-25: qty 20

## 2018-02-25 MED ORDER — ONDANSETRON HCL 4 MG/2ML IJ SOLN
INTRAMUSCULAR | Status: AC
  Filled 2018-02-25: qty 2

## 2018-02-25 MED ORDER — FLEET ENEMA 7-19 GM/118ML RE ENEM: 1.0000 | ENEMA | Freq: Once | RECTAL | Status: DC

## 2018-02-25 MED ORDER — OXYCODONE HCL 5 MG PO TABS
ORAL_TABLET | ORAL | Status: AC
  Filled 2018-02-25: qty 1

## 2018-02-25 MED ORDER — DEXAMETHASONE SODIUM PHOSPHATE 10 MG/ML IJ SOLN
INTRAMUSCULAR | Status: AC
  Filled 2018-02-25: qty 1

## 2018-02-25 MED ORDER — FAMOTIDINE 20 MG PO TABS
20.0000 mg | ORAL_TABLET | Freq: Once | ORAL | Status: AC
  Administered 2018-02-25: 20 mg via ORAL

## 2018-02-25 MED ORDER — MIDAZOLAM HCL 2 MG/2ML IJ SOLN
INTRAMUSCULAR | Status: AC
  Filled 2018-02-25: qty 2

## 2018-02-25 MED ORDER — FAMOTIDINE 20 MG PO TABS
ORAL_TABLET | ORAL | Status: AC
  Filled 2018-02-25: qty 1

## 2018-02-25 MED ORDER — ROCURONIUM BROMIDE 100 MG/10ML IV SOLN
INTRAVENOUS | Status: DC | PRN
  Administered 2018-02-25: 10 mg via INTRAVENOUS
  Administered 2018-02-25: 20 mg via INTRAVENOUS
  Administered 2018-02-25: 10 mg via INTRAVENOUS

## 2018-02-25 MED ORDER — CIPROFLOXACIN IN D5W 400 MG/200ML IV SOLN
INTRAVENOUS | Status: AC
  Filled 2018-02-25: qty 200

## 2018-02-25 MED ORDER — FENTANYL CITRATE (PF) 100 MCG/2ML IJ SOLN
INTRAMUSCULAR | Status: DC | PRN
  Administered 2018-02-25: 100 ug via INTRAVENOUS
  Administered 2018-02-25: 50 ug via INTRAVENOUS

## 2018-02-25 MED ORDER — EPHEDRINE SULFATE 50 MG/ML IJ SOLN
INTRAMUSCULAR | Status: DC | PRN
  Administered 2018-02-25: 10 mg via INTRAVENOUS

## 2018-02-25 MED ORDER — DOCUSATE SODIUM 100 MG PO CAPS: 100.0000 mg | ORAL_CAPSULE | Freq: Two times a day (BID) | ORAL | 0 refills | Status: DC

## 2018-02-25 MED ORDER — PROPOFOL 10 MG/ML IV BOLUS
INTRAVENOUS | Status: DC | PRN
  Administered 2018-02-25: 150 mg via INTRAVENOUS

## 2018-02-25 MED ORDER — ROCURONIUM BROMIDE 50 MG/5ML IV SOLN
INTRAVENOUS | Status: AC
  Filled 2018-02-25: qty 1

## 2018-02-25 MED ORDER — BACITRACIN ZINC 500 UNIT/GM EX OINT
TOPICAL_OINTMENT | CUTANEOUS | Status: AC
  Filled 2018-02-25: qty 28.35

## 2018-02-25 MED ORDER — LIDOCAINE HCL (CARDIAC) PF 100 MG/5ML IV SOSY
PREFILLED_SYRINGE | INTRAVENOUS | Status: DC | PRN
  Administered 2018-02-25: 60 mg via INTRAVENOUS

## 2018-02-25 MED ORDER — FENTANYL CITRATE (PF) 100 MCG/2ML IJ SOLN: 25.0000 ug | INTRAMUSCULAR | Status: DC | PRN

## 2018-02-25 MED ORDER — FENTANYL CITRATE (PF) 100 MCG/2ML IJ SOLN
INTRAMUSCULAR | Status: AC
  Filled 2018-02-25: qty 2

## 2018-02-25 MED ORDER — PHENYLEPHRINE HCL 10 MG/ML IJ SOLN
INTRAMUSCULAR | Status: AC
  Filled 2018-02-25: qty 1

## 2018-02-25 MED ORDER — MIDAZOLAM HCL 2 MG/2ML IJ SOLN
INTRAMUSCULAR | Status: DC | PRN
  Administered 2018-02-25: 2 mg via INTRAVENOUS

## 2018-02-25 MED ORDER — SUCCINYLCHOLINE CHLORIDE 20 MG/ML IJ SOLN
INTRAMUSCULAR | Status: AC
  Filled 2018-02-25: qty 1

## 2018-02-25 MED ORDER — OXYCODONE HCL 5 MG/5ML PO SOLN: 5.0000 mg | Freq: Once | ORAL | Status: AC | PRN

## 2018-02-25 MED ORDER — HYDROCODONE-ACETAMINOPHEN 5-325 MG PO TABS: 1.0000 | ORAL_TABLET | Freq: Four times a day (QID) | ORAL | 0 refills | Status: DC | PRN

## 2018-02-25 MED ORDER — OXYCODONE HCL 5 MG PO TABS
5.0000 mg | ORAL_TABLET | Freq: Once | ORAL | Status: AC | PRN
  Administered 2018-02-25: 5 mg via ORAL

## 2018-02-25 MED ORDER — PHENYLEPHRINE HCL 10 MG/ML IJ SOLN
INTRAMUSCULAR | Status: DC | PRN
  Administered 2018-02-25: 50 ug via INTRAVENOUS

## 2018-02-25 MED ORDER — SUGAMMADEX SODIUM 200 MG/2ML IV SOLN
INTRAVENOUS | Status: AC
  Filled 2018-02-25: qty 2

## 2018-02-25 MED ORDER — EPHEDRINE SULFATE 50 MG/ML IJ SOLN
INTRAMUSCULAR | Status: AC
  Filled 2018-02-25: qty 1

## 2018-02-25 MED ORDER — DEXAMETHASONE SODIUM PHOSPHATE 10 MG/ML IJ SOLN
INTRAMUSCULAR | Status: DC | PRN
  Administered 2018-02-25: 10 mg via INTRAVENOUS

## 2018-02-25 SURGICAL SUPPLY — 24 items
BAG URINE DRAINAGE (UROLOGICAL SUPPLIES) ×3 IMPLANT
BLADE CLIPPER SURG (BLADE) ×3 IMPLANT
CATH FOL 2WAY LX 16X5 (CATHETERS) ×3 IMPLANT
DRAPE INCISE 23X17 IOBAN STRL (DRAPES) ×2
DRAPE INCISE IOBAN 23X17 STRL (DRAPES) ×1 IMPLANT
DRAPE LEGGINS SURG 28X43 STRL (DRAPES) ×3 IMPLANT
DRAPE TABLE BACK 80X90 (DRAPES) ×3 IMPLANT
DRAPE UNDER BUTTOCK W/FLU (DRAPES) ×3 IMPLANT
DRSG TELFA 3X8 NADH (GAUZE/BANDAGES/DRESSINGS) ×3 IMPLANT
GLOVE BIO SURGEON STRL SZ 6.5 (GLOVE) ×2 IMPLANT
GLOVE BIO SURGEON STRL SZ7.5 (GLOVE) ×6 IMPLANT
GLOVE BIO SURGEONS STRL SZ 6.5 (GLOVE) ×1
GOWN STRL REUS W/ TWL LRG LVL3 (GOWN DISPOSABLE) ×2 IMPLANT
GOWN STRL REUS W/ TWL XL LVL3 (GOWN DISPOSABLE) ×1 IMPLANT
GOWN STRL REUS W/TWL LRG LVL3 (GOWN DISPOSABLE) ×4
GOWN STRL REUS W/TWL XL LVL3 (GOWN DISPOSABLE) ×2
IV NS 1000ML (IV SOLUTION) ×2
IV NS 1000ML BAXH (IV SOLUTION) ×1 IMPLANT
KIT TURNOVER CYSTO (KITS) ×3 IMPLANT
PACK CYSTO AR (MISCELLANEOUS) ×3 IMPLANT
SET CYSTO W/LG BORE CLAMP LF (SET/KITS/TRAYS/PACK) ×3 IMPLANT
SURGILUBE 2OZ TUBE FLIPTOP (MISCELLANEOUS) ×3 IMPLANT
SYR 10ML LL (SYRINGE) ×3 IMPLANT
WATER STERILE IRR 1000ML POUR (IV SOLUTION) ×3 IMPLANT

## 2018-02-25 NOTE — OR Nursing (Signed)
Dr. Erlene Quan notified that bladder scan revealed 446cc urine in bladder.  Patent has tried repeatedly to empty bladder unsuccessfully. Order for coudette catheter insertion/ foley to dc home.  Will do voiding trial in 3 days.

## 2018-02-25 NOTE — Anesthesia Procedure Notes (Signed)
Procedure Name: Intubation Date/Time: 02/25/2018 7:56 AM Performed by: Jonna Clark, CRNA Pre-anesthesia Checklist: Patient identified, Patient being monitored, Timeout performed, Emergency Drugs available and Suction available Patient Re-evaluated:Patient Re-evaluated prior to induction Oxygen Delivery Method: Circle system utilized Preoxygenation: Pre-oxygenation with 100% oxygen Induction Type: IV induction Ventilation: Mask ventilation without difficulty Laryngoscope Size: Mac and 3 Grade View: Grade I Tube type: Oral Tube size: 7.5 mm Number of attempts: 1 Placement Confirmation: ETT inserted through vocal cords under direct vision,  positive ETCO2 and breath sounds checked- equal and bilateral Secured at: 21 cm Tube secured with: Tape Dental Injury: Teeth and Oropharynx as per pre-operative assessment

## 2018-02-25 NOTE — Anesthesia Preprocedure Evaluation (Signed)
Anesthesia Evaluation  Patient identified by MRN, date of birth, ID band Patient awake    Reviewed: Allergy & Precautions, NPO status , Patient's Chart, lab work & pertinent test results  History of Anesthesia Complications Negative for: history of anesthetic complications  Airway Mallampati: II  TM Distance: >3 FB Neck ROM: Full    Dental  (+) Upper Dentures, Lower Dentures   Pulmonary neg sleep apnea, neg COPD, former smoker,    breath sounds clear to auscultation- rhonchi (-) wheezing      Cardiovascular hypertension, Pt. on medications (-) CAD, (-) Past MI, (-) Cardiac Stents and (-) CABG  Rhythm:Regular Rate:Normal - Systolic murmurs and - Diastolic murmurs    Neuro/Psych neg Seizures negative neurological ROS  negative psych ROS   GI/Hepatic negative GI ROS, Neg liver ROS,   Endo/Other  negative endocrine ROSneg diabetes  Renal/GU Renal disease: nephrolithiasis.     Musculoskeletal negative musculoskeletal ROS (+)   Abdominal (+) - obese,   Peds  Hematology  (+) anemia ,   Anesthesia Other Findings Past Medical History: No date: Hemolytic anemia (HCC) No date: Hypertension No date: Nephrolithiasis   Reproductive/Obstetrics                             Anesthesia Physical Anesthesia Plan  ASA: II  Anesthesia Plan: General   Post-op Pain Management:    Induction: Intravenous  PONV Risk Score and Plan: 1 and Ondansetron and Midazolam  Airway Management Planned: Oral ETT  Additional Equipment:   Intra-op Plan:   Post-operative Plan: Extubation in OR  Informed Consent: I have reviewed the patients History and Physical, chart, labs and discussed the procedure including the risks, benefits and alternatives for the proposed anesthesia with the patient or authorized representative who has indicated his/her understanding and acceptance.     Dental advisory given  Plan  Discussed with: CRNA and Anesthesiologist  Anesthesia Plan Comments:         Anesthesia Quick Evaluation

## 2018-02-25 NOTE — Discharge Instructions (Signed)
Braquiterapia para el cncer de prstata, cuidados posteriores Brachytherapy for Prostate Cancer, Care After  Lea esta informacin sobre cmo cuidarse despus del procedimiento. Su mdico tambin podr darle indicaciones ms especficas. Comunquese con su mdico si tiene problemas o preguntas. Qu puedo esperar despus del procedimiento? Despus del procedimiento, es comn [employer] siguientes sntomas:  Dificultad para Garment/textile technologist.  Notar sangre o semen en la orina.  Estreimiento.  Sentir con frecuencia que Ship broker.  Hematoma, hinchazn y sensibilidad en la zona detrs del escroto (perineo).  Meteorismo y gases.  Fatiga.  Dolor o Armed forces logistics/support/administrative officer.  Problemas para lograr o Visual merchandiser ereccin (disfuncin erctil).  Nuseas. Siga estas indicaciones en su casa: Control del dolor, de la rigidez y de la hinchazn  Si se lo indican, aplique hielo en la zona afectada: ? Ponga el hielo en una bolsa plstica. ? Coloque una [employer] piel y la bolsa de hielo. ? Coloque el hielo durante 2minutos, de 2 a 3veces por da.  Trate de no sentarse directamente sobre el rea que se encuentra detrs del escroto. Un almohadn suave puede [employer]. Actividad  No conduzca durante 24horas si le dieron un medicamento para ayudarlo a que se relaje (sedante).  No conduzca ni use maquinaria pesada mientras toma analgsicos recetados.  Haga reposo como se lo haya indicado el [place] personas pueden reanudar sus actividades normales pocos das o semanas despus del procedimiento. Pregntele al mdico qu actividades son seguras para usted. Comida y bebida  Beba suficiente lquido para mantener la orina clara o de color amarillo plido.  Consuma una dieta saludable y equilibrada. Esto incluye muchas frutas y verduras, cereales integrales y protenas magras. Instrucciones generales  [employer] de venta libre y los  recetados solamente como se lo haya indicado el mdico.  Consulting civil engineer a todas las visitas de control como se lo haya indicado el mdico. Esto es importante. Es posible que necesite tratamiento adicional.  No tome baos de inmersin, no nade ni use el jacuzzi hasta que el mdico lo autorice. Tome una ducha y lave suavemente la zona que se encuentra detrs del escroto.  No tenga relaciones sexuales durante una semana despus del tratamiento o hasta que el mdico lo autorice.  Si tiene implantes para braquiterapia permanentes de baja dosis: ? Limite el contacto cercano con nios y mujeres embarazadas durante 2 meses, o segn las indicaciones de su mdico. Esto es importante por la radiacin que permanece activa en la prstata. ? Puede desactivar sensores radiactivos, como los detectores de los aeropuertos. Pdale a su mdico un documento con la explicacin del tratamiento. ? Es posible que Engineering geologist uso de un preservativo durante las relaciones sexuales por los primeros dos meses despus de la braquiterapia de dosis baja. Comunquese con un mdico si:  Tiene fiebre o siente escalofros.  No tiene deposiciones en 3 o 4das posteriores al procedimiento.  Tiene diarrea durante 3 o 4das despus del procedimiento.  Presenta algn sntoma nuevo, como problemas con la miccin o disfuncin erctil.  Siente dolor en el abdomen (abdominal).  Observa ms sangre en la orina. Solicite ayuda de inmediato si:  No puede orinar.  Hay un sangrado excesivo en el recto.  Tiene secrecin inusual que proviene del recto.  Siente dolor intenso en la zona tratada que no desaparece con los analgsicos.  Tiene nuseas o vmitos intensos. Resumen  Si tiene implantes para braquiterapia permanentes de baja dosis, limite el  contacto cercano con nios y embarazadas durante 2 meses o segn las indicaciones de su mdico. Esto es importante por la radiacin que permanece activa en la prstata.  Hable con su  mdico sobre los riesgos de los efectos secundarios de la braquiterapia, como la disfuncin erctil o la dificultad para Garment/textile technologist. El mdico le podr recomendar opciones posibles de tratamientos.  Concurra a todas las visitas de control como se lo haya indicado el mdico. Esto es importante. Es posible que necesite tratamiento adicional. Esta informacin no tiene Marine scientist el consejo del mdico. Asegrese de hacerle al mdico cualquier pregunta que tenga. Document Released: 09/20/2011 Document Revised: 08/15/2016 Document Reviewed: 08/15/2016 Elsevier Interactive Patient Education  2019 [place] for Prostate Cancer, Care After  This sheet gives you information about how to care for yourself after your procedure. Your health care provider may also give you more specific instructions. If you have problems or questions, contact your health care provider. What can I expect after the procedure? After the procedure, it is common to have:  Trouble passing urine.  Blood in the urine or semen.  Constipation.  Frequent feeling of an urgent need to urinate.  Bruising, swelling, and tenderness of the area behind the scrotum (perineum).  Bloating and gas.  Fatigue.  Burning or pain in the rectum.  Problems getting or keeping an erection (erectile dysfunction).  Nausea. Follow these instructions at home: Managing pain, stiffness, and swelling  If directed, apply ice to the affected area: ? Put ice in a plastic bag. ? Place a towel between your skin and the bag. ? Leave the ice on for 20 minutes, 2-3 times a day.  Try not to sit directly on the area behind the scrotum. A soft cushion can help with discomfort. Activity  Do not drive for 24 hours if you were given a medicine to help you relax (sedative).  Do not drive or use heavy machinery while taking prescription pain medicine.  Rest as told by your health care provider.  Most people can return to normal  activities a few days or weeks after the procedure. Ask your health care provider what activities are safe for you. Eating and drinking  Drink enough fluid to keep your urine clear or pale yellow.  Eat a healthy, balanced diet. This includes lean proteins, whole grains, and plenty of fruits and vegetables. General instructions  Take over-the-counter and prescription medicines only as told by your health care provider.  Keep all follow-up visits as told by your health care provider. This is important. You may still need additional treatment.  Do not take baths, swim, or use a hot tub until your health care provider approves. Shower and wash the area behind the scrotum gently.  Do not have sex for one week after the treatment, or until your health care provider approves.  If you have permanent, low-dose brachytherapy implants: ? Limit close contact with children and pregnant women for 2 months or as told by your health care provider. This is important because of the radiation that is still active in the prostate. ? You may set off radioactive sensors, such as airport screenings. Ask your health care provider for a document that explains your treatment. ? You may be instructed to use a condom during sex for the first 2 months after low-dose brachytherapy. Contact a health care provider if:  You have a fever or chills.  You do not have a bowel movement for 3-4 days after the procedure.  You have diarrhea for 3-4 days after the procedure.  You develop any new symptoms, such as problems with urinating or erectile dysfunction.  You have abdomen (abdominal) pain.  You have more blood in your urine. Get help right away if:  You cannot urinate.  There is excessive bleeding from your rectum.  You have unusual drainage coming from your rectum.  You have severe pain in the treated area that does not go away with pain medicine.  You have severe nausea or vomiting. Summary  If you have  permanent, low-dose brachytherapy implants, limit close contact with children and pregnant women for 2 months or as told by your health care provider. This is important because of the radiation that is still active in the prostate.  Talk with your health care provider about your risk of brachytherapy side effects, such as erectile dysfunction or urinary problems. Your health care provider will be able to recommend possible treatment options.  Keep all follow-up visits as told by your health care provider. This is important. You may need additional treatment. This information is not intended to replace advice given to you by your health care provider. Make sure you discuss any questions you have with your health care provider. Document Released: 01/28/2010 Document Revised: 01/28/2016 Document Reviewed: 01/28/2016 Elsevier Interactive Patient Education  2019 [place]   1) The drugs that you were given will stay in your system until tomorrow so for the next 24 hours you should not:  A) Drive an automobile B) Make any legal decisions C) Drink any alcoholic beverage   2) You may resume regular meals tomorrow.  Today it is better to start with liquids and gradually work up to solid foods.  You may eat anything you prefer, but it is better to start with liquids, then soup and crackers, and gradually work up to solid foods.   3) Please notify your doctor immediately if you have any unusual bleeding, trouble breathing, redness and pain at the surgery site, drainage, fever, or pain not relieved by medication.    4) Additional Instructions:   Please contact your physician with any problems or Same Day Surgery at [phone], Monday through Friday 6 am to 4 pm, or [place] at [facility] number at [phone].

## 2018-02-25 NOTE — OR Nursing (Signed)
Coudette catheter inserted per Larita Fife RN with 460 cc of pink urine returned to bag..  Instructions for foley care to family.  Verbalizes understanding.

## 2018-02-25 NOTE — Anesthesia Postprocedure Evaluation (Signed)
Anesthesia Post Note  Patient: Anthony Castaneda  Procedure(s) Performed: RADIOACTIVE SEED IMPLANT/BRACHYTHERAPY IMPLANT (N/A Prostate)  Patient location during evaluation: PACU Anesthesia Type: General Level of consciousness: awake and alert and oriented Pain management: pain level controlled Vital Signs Assessment: post-procedure vital signs reviewed and stable Respiratory status: spontaneous breathing, nonlabored ventilation and respiratory function stable Cardiovascular status: blood pressure returned to baseline and stable Postop Assessment: no signs of nausea or vomiting Anesthetic complications: no     Last Vitals:  Vitals:   02/25/18 0955 02/25/18 1007  BP: 139/80 (!) 173/92  Pulse: 72 85  Resp: 12   Temp:  (!) 36.2 C  SpO2: 97% 97%    Last Pain:  Vitals:   02/25/18 1007  TempSrc: Temporal  PainSc: 0-No pain                 Aceyn Kathol

## 2018-02-25 NOTE — Interval H&P Note (Signed)
History and Physical Interval Note:  02/25/2018 7:26 AM  Anthony Castaneda  has presented today for surgery, with the diagnosis of prostate cancer  The various methods of treatment have been discussed with the patient and family. After consideration of risks, benefits and other options for treatment, the patient has consented to  Procedure(s): RADIOACTIVE SEED IMPLANT/BRACHYTHERAPY IMPLANT (N/A) as a surgical intervention .  The patient's history has been reviewed, patient examined, no change in status, stable for surgery.  I have reviewed the patient's chart and labs.  Questions were answered to the patient's satisfaction.    RRR CTAB   Hollice Espy

## 2018-02-25 NOTE — Progress Notes (Signed)
Radiation Oncology Follow up Note  Name: Anthony Castaneda   Date:   02/25/2018 MRN:  [id number] DOB: 06-18-1950    This 68 y.o. male presents to [facility] today for I-123 5 interstitial implant for stage IIB adenocarcinoma the prostate  REFERRING PROVIDER: Theotis Burrow*  HPI: patient is a 68 year old male.who presented with a PSA of 7.3. Transrectal biopsy was positive for mostly Gleason 7 (3+4) adenocarcinoma. He was taken to the OR today and under ultrasound guidance sources were placed in his prostate after volume study completion approximate 1 month prior.  COMPLICATIONS OF TREATMENT: none  FOLLOW UP COMPLIANCE: keeps appointments   PHYSICAL EXAM:  There were no vitals taken for this visit. Well-developed well-nourished patient in NAD. HEENT reveals PERLA, EOMI, discs not visualized.  Oral cavity is clear. No oral mucosal lesions are identified. Neck is clear without evidence of cervical or supraclavicular adenopathy. Lungs are clear to A&P. Cardiac examination is essentially unremarkable with regular rate and rhythm without murmur rub or thrill. Abdomen is benign with no organomegaly or masses noted. Motor sensory and DTR levels are equal and symmetric in the upper and lower extremities. Cranial nerves II through XII are grossly intact. Proprioception is intact. No peripheral adenopathy or edema is identified. No motor or sensory levels are noted. Crude visual fields are within normal range.  RADIOLOGY RESULTS: ultrasound used for source placement  PLAN: patient was taken to the operating room and general anesthesia was administered. Legs were immobilized in stirrups and patient was positioned in the exact same proportions as original volume study. Patient was prepped and Foley catheter was placed. Ultrasound guidance identified the prostate and recreated the original set up as per treatment planning volume study. 29 needles were placed under ultrasound guidance with PVCs  delivered to the prostate volume. After completion of procedure cystoscopy was performed by urology and no evidence of seeds in the bladder were noted. Patient tolerated the procedure extremely well. Initial plain film as doublecheck identified 85 seeds in the prostate. Patient has followup appointment in one month for CT scan for quality assurance will be performed.     Noreene Filbert, MD

## 2018-02-25 NOTE — Op Note (Signed)
Preoperative diagnosis: Adenocarcinoma of the prostate   Postoperative diagnosis: Same   Procedure: I-125 prostate seed implantation, cystoscopy  Surgeon: Hollice Espy M.D. ,   Radiation Oncologist: Lavena Stanford, M.D.   Anesthesia: General  Drains: none  Complications: none   Procedure: Patient was brought to operating suite and placement table in the supine position. At this time, a universal timeout protocol was performed, all team members were identified, Venodyne boots are placed, and he was administered IV Ancef in the preoperative period. He was placed in lithotomy position and prepped and draped in usual manner. Radiation oncology department placed a transrectal ultrasound probe anchoring stand/ grid and aligned with previous imaging from the volume study. Foley catheter was inserted without difficulty.  All needle passage was done with real-time transrectal ultrasound guidance in both the transverse and sagittal plains in order to achieve the desired preplanned position. A total of 29 needles were placed.  85 active seeds were implanted. The Foley catheter was removed and a rigid cystoscopy failed to show any seeds outside the prostate without evidence of trauma to the urethral, prostatic fossa, or bladder.  The bladder was drained.  A fluoroscopic image was then obtained showing excellent distrubution of the brachytherapy seeds.  Each seed was counted and counts were correct.    The patient was then repositioned in the supine position, reversed from anesthesia, and taken to the PACU in stable condition.

## 2018-02-25 NOTE — Transfer of Care (Signed)
Immediate Anesthesia Transfer of Care Note  Patient: Anthony Castaneda  Procedure(s) Performed: RADIOACTIVE SEED IMPLANT/BRACHYTHERAPY IMPLANT (N/A Prostate)  Patient Location: PACU  Anesthesia Type:General  Level of Consciousness: drowsy and patient cooperative  Airway & Oxygen Therapy: Patient Spontanous Breathing and Patient connected to face mask oxygen  Post-op Assessment: Report given to RN and Post -op Vital signs reviewed and stable  Post vital signs: Reviewed and stable  Last Vitals:  Vitals Value Taken Time  BP 134/76 02/25/2018  9:14 AM  Temp    Pulse 70 02/25/2018  9:15 AM  Resp 19 02/25/2018  9:15 AM  SpO2 99 % 02/25/2018  9:15 AM  Vitals shown include unvalidated device data.  Last Pain:  Vitals:   02/25/18 0612  TempSrc: Tympanic  PainSc: 0-No pain         Complications: No apparent anesthesia complications

## 2018-02-25 NOTE — OR Nursing (Signed)
Conference Phone call  with interpreter from patient because he thought the catheter came out 2 in.  He says there is pink urine in the tubing and bag. He has not pulled on the catheter. He feels like he needs to strain for the urine to flow.  I instructed him not to strain. The urine will flow without effort. Instructed if there is urine in the bag and tubing it has not become dislodged.

## 2018-02-25 NOTE — Anesthesia Post-op Follow-up Note (Signed)
Anesthesia QCDR form completed.        

## 2018-02-28 ENCOUNTER — Ambulatory Visit (INDEPENDENT_AMBULATORY_CARE_PROVIDER_SITE_OTHER): Payer: 59 | Admitting: Family Medicine

## 2018-02-28 DIAGNOSIS — C61 Malignant neoplasm of prostate: Secondary | ICD-10-CM

## 2018-02-28 NOTE — Progress Notes (Signed)
Fill and Pull Catheter Removal  Patient is present today for a catheter removal.  Patient was cleaned and prepped in a sterile fashion 250ml of sterile water/ saline was instilled into the bladder when the patient felt the urge to urinate. 50ml of water was then drained from the balloon.  A 14FR coude cath was removed from the bladder no complications were noted .  Patient as then given some time to void on their own.  Patient Can void  284ml on their own after some time.  Patient tolerated well.  Preformed by: Elberta Leatherwood, CMA  Follow up/ Additional notes: As scheduled

## 2018-03-01 ENCOUNTER — Other Ambulatory Visit: Payer: Self-pay

## 2018-03-01 ENCOUNTER — Encounter: Payer: Self-pay | Admitting: Emergency Medicine

## 2018-03-01 ENCOUNTER — Emergency Department
Admission: EM | Admit: 2018-03-01 | Discharge: 2018-03-01 | Disposition: A | Discharge status: Home / Self Care | Payer: 59 | Attending: Emergency Medicine | Admitting: Emergency Medicine

## 2018-03-01 ENCOUNTER — Encounter: Payer: Self-pay | Admitting: Urology

## 2018-03-01 DIAGNOSIS — I10 Essential (primary) hypertension: Secondary | ICD-10-CM | POA: Diagnosis not present

## 2018-03-01 DIAGNOSIS — Z79899 Other long term (current) drug therapy: Secondary | ICD-10-CM | POA: Insufficient documentation

## 2018-03-01 DIAGNOSIS — Z87891 Personal history of nicotine dependence: Secondary | ICD-10-CM | POA: Insufficient documentation

## 2018-03-01 DIAGNOSIS — R339 Retention of urine, unspecified: Secondary | ICD-10-CM

## 2018-03-01 LAB — URINALYSIS, COMPLETE (UACMP) WITH MICROSCOPIC
Bacteria, UA: NONE SEEN
Bilirubin Urine: NEGATIVE
Glucose, UA: NEGATIVE mg/dL
Ketones, ur: NEGATIVE mg/dL
Leukocytes,Ua: NEGATIVE
Nitrite: NEGATIVE
Protein, ur: NEGATIVE mg/dL
Specific Gravity, Urine: 1.004 — ABNORMAL LOW (ref 1.005–1.030)
Squamous Epithelial / HPF: NONE SEEN (ref 0–5)
pH: 6 (ref 5.0–8.0)

## 2018-03-01 LAB — CBC
HCT: 50.1 % (ref 39.0–52.0)
Hemoglobin: 16.6 g/dL (ref 13.0–17.0)
MCH: 29.9 pg (ref 26.0–34.0)
MCHC: 33.1 g/dL (ref 30.0–36.0)
MCV: 90.3 fL (ref 80.0–100.0)
Platelets: 314 10*3/uL (ref 150–400)
RBC: 5.55 MIL/uL (ref 4.22–5.81)
RDW: 14.5 % (ref 11.5–15.5)
WBC: 8 10*3/uL (ref 4.0–10.5)
nRBC: 0 % (ref 0.0–0.2)

## 2018-03-01 LAB — BASIC METABOLIC PANEL
Anion gap: 11 (ref 5–15)
BUN: 10 mg/dL (ref 8–23)
CHLORIDE: 105 mmol/L (ref 98–111)
CO2: 21 mmol/L — ABNORMAL LOW (ref 22–32)
CREATININE: 0.69 mg/dL (ref 0.61–1.24)
Calcium: 9.2 mg/dL (ref 8.9–10.3)
GFR calc Af Amer: >60 mL/min (ref >60)
GFR calc non Af Amer: >60 mL/min (ref >60)
Glucose, Bld: 116 mg/dL — ABNORMAL HIGH (ref 70–99)
Potassium: 3.6 mmol/L (ref 3.5–5.1)
Sodium: 137 mmol/L (ref 135–145)

## 2018-03-01 MED ORDER — LIDOCAINE HCL URETHRAL/MUCOSAL 2 % EX GEL
1.0000 "application " | Freq: Once | CUTANEOUS | Status: AC
  Administered 2018-03-01: 1 via URETHRAL
  Filled 2018-03-01: qty 10

## 2018-03-01 MED ORDER — FENTANYL CITRATE (PF) 100 MCG/2ML IJ SOLN
50.0000 ug | Freq: Once | INTRAMUSCULAR | Status: AC
  Administered 2018-03-01: 50 ug via INTRAVENOUS
  Filled 2018-03-01: qty 2

## 2018-03-01 NOTE — ED Notes (Signed)
Called supply chain to bring urology cart

## 2018-03-01 NOTE — ED Notes (Signed)
Pt sent home with coude 27F cath in place and leg bag - instructed on how to drain bag and when to follow up with urologist

## 2018-03-01 NOTE — ED Triage Notes (Addendum)
States had prostate surgery on Monday.  Foley removed yesterday at 0900.  Patient states was ok until about 1500, since then feels pressure and difficulty voiding.  States only passing small drops of urine.  Patient is AAOx3.  Skin warm and dry. In obvious discomfort.

## 2018-03-01 NOTE — ED Notes (Signed)
Bladder scanned patient and found 200. Accuracy questionable. Provider did an ultrasound and noted bladder to be distended and full

## 2018-03-01 NOTE — ED Provider Notes (Signed)
[facility] Emergency Department Provider Note   ____________________________________________   First MD Initiated Contact with Patient 03/01/18 0827     (approximate)  I have reviewed the triage vital signs and the nursing notes.   HISTORY  Chief Complaint Urinary Retention    HPI RAED SCHALK is a 68 y.o. male who presents for evaluation of difficulty urinating  Patient reports he had seeds placed in his prostate Monday, and his catheter removed yesterday.  After removal he seemed to urinate well through most the day but then noticed he had a little bit of trouble later in the evening, by 3 AM he was having trouble urinating at all.  He drank to 3 bottles of water and then this morning reports he just cannot urinate except for just a couple of drops but has a very strong urge to urinate.  Reports it is moderately uncomfortable to painful.  No bleeding.  No blood in his urine.  No fevers or chills.  No nausea or vomiting.   Past Medical History:  Diagnosis Date  . Hemolytic anemia ([place])   . Hypertension   . Nephrolithiasis     Patient Active Problem List   Diagnosis Date Noted  . Hemolytic anemia ([place]) 06/19/2014    Past Surgical History:  Procedure Laterality Date  . APPENDECTOMY     pt denies  . CHOLECYSTECTOMY    . COLONOSCOPY WITH PROPOFOL N/A 09/27/2015   Procedure: COLONOSCOPY WITH PROPOFOL;  Surgeon: Manya Silvas, MD;  Location: [facility] ENDOSCOPY;  Service: Endoscopy;  Laterality: N/A;  . RADIOACTIVE SEED IMPLANT N/A 02/25/2018   Procedure: RADIOACTIVE SEED IMPLANT/BRACHYTHERAPY IMPLANT;  Surgeon: Hollice Espy, MD;  Location: [facility] ORS;  Service: Urology;  Laterality: N/A;  . SPLENECTOMY    . VOLUME STUDY      Prior to Admission medications   Medication Sig Start Date End Date Taking? Authorizing Provider  docusate sodium (COLACE) 100 MG capsule Take 1 capsule (100 mg total) by mouth 2 (two) times daily. 02/25/18  Yes Hollice Espy, MD  HYDROcodone-acetaminophen (NORCO/VICODIN) 5-325 MG tablet Take 1-2 tablets by mouth every 6 (six) hours as needed for moderate pain. 02/25/18  Yes Hollice Espy, MD  lisinopril (PRINIVIL,ZESTRIL) 10 MG tablet Take 10 mg by mouth daily.   Yes [provider]  tamsulosin (FLOMAX) 0.4 MG CAPS capsule Take 1 capsule (0.4 mg total) by mouth daily. 02/25/18  Yes Hollice Espy, MD    Allergies Patient has no known allergies.  No family history on file.  Social History Social History   Tobacco Use  . Smoking status: Former Research scientist (life sciences)  . Smokeless tobacco: Never Used  . Tobacco comment: At the age of 57; he smoked for 2 years and then quit...  Substance Use Topics  . Alcohol use: No    Alcohol/week: 0.0 standard drinks  . Drug use: No    Review of Systems Constitutional: No fever/chills Eyes: No visual changes. ENT: No sore throat. Cardiovascular: Denies chest pain. Respiratory: Denies shortness of breath. Gastrointestinal: No abdominal pain except he feels like his bladder is very full.   Genitourinary: Negative for dysuria. Musculoskeletal: Negative for back pain. Skin: Negative for rash. Neurological: Negative for headaches, areas of focal weakness or numbness.    ____________________________________________   PHYSICAL EXAM:  VITAL SIGNS: ED Triage Vitals  Enc Vitals Group     BP 03/01/18 0817 (!) 160/101     Pulse Rate 03/01/18 0817 (!) 102     Resp  03/01/18 0817 (!) 22     Temp 03/01/18 0817 97.6 F (36.4 C)     Temp Source 03/01/18 0817 Oral     SpO2 03/01/18 0817 96 %     Weight 03/01/18 0814 170 lb 9.6 oz (77.4 kg)     Height 03/01/18 0814 5\' 6"  (1.676 m)     Head Circumference --      Peak Flow --      Pain Score 03/01/18 0813 10     Pain Loc --      Pain Edu? --      Excl. in [place]? --     Constitutional: Alert and oriented. Well appearing and in no acute distress.  Standing up, appears uncomfortable attempting to urinate but  unsuccessful at this time. Eyes: Conjunctivae are normal. Head: Atraumatic. Nose: No congestion/rhinnorhea. Mouth/Throat: Mucous membranes are moist. Neck: No stridor.  Cardiovascular: Normal rate, regular rhythm. Grossly normal heart sounds.  Good peripheral circulation. Respiratory: Normal respiratory effort.  No retractions. Lungs CTAB. Gastrointestinal: Soft and nontender.  The lower abdomen bladder is palpably distended. Musculoskeletal: No lower extremity tenderness nor edema. Neurologic:  Normal speech and language. No gross focal neurologic deficits are appreciated.  Skin:  Skin is warm, dry and intact. No rash noted. Psychiatric: Mood and affect are normal. Speech and behavior are normal.  ____________________________________________   LABS (all labs ordered are listed, but only abnormal results are displayed)  Labs Reviewed  URINALYSIS, COMPLETE (UACMP) WITH MICROSCOPIC - Abnormal; Notable for the following components:      Result Value   Color, Urine STRAW (*)    APPearance CLEAR (*)    Specific Gravity, Urine 1.004 (*)    Hgb urine dipstick LARGE (*)    All other components within normal limits  BASIC METABOLIC PANEL - Abnormal; Notable for the following components:   CO2 21 (*)    Glucose, Bld 116 (*)    All other components within normal limits  CBC   ____________________________________________  EKG   ____________________________________________  RADIOLOGY   ____________________________________________   PROCEDURES  Procedure(s) performed: None  Procedures  Critical Care performed: No  ____________________________________________   INITIAL IMPRESSION / ASSESSMENT AND PLAN / ED COURSE  Pertinent labs & imaging results that were available during my care of the patient were reviewed by me and considered in my medical decision making (see chart for details).     Clinical Course as of Mar 02 1035  Fri Mar 01, 2018  0833 [facility], Dr.  Diamantina Providence   [MQ]  [phone] Case and care discussed with urology.  Dr. Diamantina Providence reports would reocmmend 18G caude if post void is elevated. No antibiotics if UA negative. Follow-up in 1 week. Already on flomax.   [MQ]    Clinical Course User Index [MQ] Delman Kitten, MD    ----------------------------------------- 10:36 AM on 03/01/2018 -----------------------------------------  Patient reports he feels much better.  Urine draining clear, yellow.  Patient reports no ongoing pain or discomfort.  Comfortable with plan for discharge and follow-up with Dr. Erlene Quan in 1 week.  Return precautions advised.  Patient was offered Spanish interpreter a couple times will first when I saw him and also at the time of discharge, he reports he understands English well and does not wish for an interpreter. ____________________________________________   FINAL CLINICAL IMPRESSION(S) / ED DIAGNOSES  Final diagnoses:  Urinary retention        Note:  This document was prepared using Dragon voice recognition software and may  include unintentional dictation errors       Delman Kitten, MD 03/01/18 1037

## 2018-03-12 ENCOUNTER — Encounter: Payer: Self-pay | Admitting: Urology

## 2018-03-12 ENCOUNTER — Ambulatory Visit (INDEPENDENT_AMBULATORY_CARE_PROVIDER_SITE_OTHER): Payer: 59 | Admitting: Urology

## 2018-03-12 VITALS — BP 154/74 | HR 85 | Ht 65.0 in | Wt 171.9 lb

## 2018-03-12 DIAGNOSIS — N138 Other obstructive and reflux uropathy: Secondary | ICD-10-CM | POA: Diagnosis not present

## 2018-03-12 DIAGNOSIS — N401 Enlarged prostate with lower urinary tract symptoms: Secondary | ICD-10-CM

## 2018-03-12 DIAGNOSIS — C61 Malignant neoplasm of prostate: Secondary | ICD-10-CM

## 2018-03-12 LAB — BLADDER SCAN AMB NON-IMAGING

## 2018-03-12 MED ORDER — TAMSULOSIN HCL 0.4 MG PO CAPS: 0.4000 mg | ORAL_CAPSULE | Freq: Every day | ORAL | 11 refills | Status: DC

## 2018-03-12 NOTE — Progress Notes (Signed)
Fill and Pull Catheter Removal  Patient is present today for a catheter removal.  Patient was cleaned and prepped in a sterile fashion 168ml of sterile water/ saline was instilled into the bladder when the patient felt the urge to urinate. 29ml of water was then drained from the balloon.  A 18FR foley cath was removed from the bladder no complications were noted .  Patient as then given some time to void on their own.  Patient can void  251ml on their own after some time.  Patient tolerated well.  Preformed by: Gordy Clement, CMA (AAMA)  Follow up/ Additional notes: RTC as scheduled    Continuous Intermittent Catheterization  Due to urinary retention patient is present today for a teaching of self I & O Catheterization. Patient was given detailed verbal and printed instructions of self catheterization. Patient was cleaned and prepped in a sterile fashion.  With instruction and assistance patient inserted a 14FR Coude and urine return was noted 100 ml, urine was pale yellow in color. Patient tolerated well, no complications were noted Patient was given a sample bag with supplies to take home.  Instructions were given per Dr.Sninsky for patient to cath PRN.

## 2018-03-12 NOTE — Progress Notes (Signed)
   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  Today's visit was conducted via a Patent attorney.  To briefly summarize, he is a 68 year old male who was recently diagnosed in November 2019 with unfavorable intermediate risk prostate cancer(though most of his cores were Gleason score 3+4 = 7), with PSA of 7.3, and prostate volume of 49 g.   He underwent brachytherapy with Dr. Erlene Quan and Dr. Erma Pinto on 02/25/2018.  He developed retention postoperatively, and a Foley catheter was placed.  This was removed on 2/20 in urology clinic and he passed a void trial at that time.    He then re-presented to the emergency department the next morning with recurrent urinary retention, and Foley catheter was placed with return of yellow urine.  He has been on Flomax since his brachytherapy procedure.  Foley was removed today and he was able to void 200 cc of clear yellow urine.  He was also taught clean intermittent catheterization in case he develops recurrent retention.  I instructed him to continue Flomax nightly.  I provided reassurance that his urinary symptoms will likely continue to improve as he gets further out from his brachytherapy procedure.  He has follow-up later this month with Dr. Baruch Gouty, and will see me back in July 2020 with a repeat PSA.  RTC 4 months with PSA Continue Flomax  A total of 15 minutes were spent face-to-face with the patient, greater than 50% was spent in patient education, counseling, and coordination of care regarding postoperative expectations after brachytherapy, and urinary symptoms.   [facility] [address] [place], [place] [address] [phone]

## 2018-03-26 ENCOUNTER — Ambulatory Visit: Payer: 59 | Admitting: Radiation Oncology

## 2018-03-26 ENCOUNTER — Other Ambulatory Visit: Payer: Self-pay

## 2018-03-26 ENCOUNTER — Ambulatory Visit: Payer: 59

## 2018-03-27 ENCOUNTER — Other Ambulatory Visit: Payer: Self-pay

## 2018-03-27 ENCOUNTER — Ambulatory Visit: Payer: 59

## 2018-03-27 ENCOUNTER — Ambulatory Visit
Admission: RE | Admit: 2018-03-27 | Discharge: 2018-03-27 | Disposition: A | Discharge status: Home / Self Care | Payer: 59 | Source: Ambulatory Visit | Attending: Radiation Oncology | Admitting: Radiation Oncology

## 2018-03-27 ENCOUNTER — Other Ambulatory Visit: Payer: Self-pay | Admitting: *Deleted

## 2018-03-27 VITALS — BP 134/83 | HR 71 | Temp 97.0°F | Resp 18 | Wt 168.4 lb

## 2018-03-27 DIAGNOSIS — R351 Nocturia: Secondary | ICD-10-CM | POA: Diagnosis not present

## 2018-03-27 DIAGNOSIS — Z923 Personal history of irradiation: Secondary | ICD-10-CM | POA: Insufficient documentation

## 2018-03-27 DIAGNOSIS — C61 Malignant neoplasm of prostate: Secondary | ICD-10-CM | POA: Insufficient documentation

## 2018-03-27 DIAGNOSIS — R319 Hematuria, unspecified: Secondary | ICD-10-CM | POA: Insufficient documentation

## 2018-03-27 DIAGNOSIS — R350 Frequency of micturition: Secondary | ICD-10-CM | POA: Insufficient documentation

## 2018-03-27 MED ORDER — TAMSULOSIN HCL 0.4 MG PO CAPS: 0.4000 mg | ORAL_CAPSULE | Freq: Two times a day (BID) | ORAL | 12 refills | Status: DC

## 2018-03-27 NOTE — Progress Notes (Signed)
Radiation Oncology Follow up Note  Name: Anthony Castaneda   Date:   03/27/2018 MRN:  [id number] DOB: Apr 30, 1950    This 68 y.o. male presents to the clinic today for one-month follow-up status post I-125 interstitial implant for stage IIB adenocarcinoma the prostate.  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 Seen today in routine follow-up he is having some urgency and frequency of urination nocturia most every hour. He is on Flomax atnight I've asked him to increase that to him a.m. dosage also. Patient is also having occasional hematuria.He had a CT scan for quality assurance they showing excellent placement of his seed sources.  COMPLICATIONS OF TREATMENT: none  FOLLOW UP COMPLIANCE: keeps appointments   PHYSICAL EXAM:  BP 134/83   Pulse 71   Temp (!) 97 F (36.1 C)   Resp 18   Wt 168 lb 6.9 oz (76.4 kg)   BMI 28.03 kg/m  Well-developed well-nourished patient in NAD. HEENT reveals PERLA, EOMI, discs not visualized.  Oral cavity is clear. No oral mucosal lesions are identified. Neck is clear without evidence of cervical or supraclavicular adenopathy. Lungs are clear to A&P. Cardiac examination is essentially unremarkable with regular rate and rhythm without murmur rub or thrill. Abdomen is benign with no organomegaly or masses noted. Motor sensory and DTR levels are equal and symmetric in the upper and lower extremities. Cranial nerves II through XII are grossly intact. Proprioception is intact. No peripheral adenopathy or edema is identified. No motor or sensory levels are noted. Crude visual fields are within normal range.  RADIOLOGY RESULTS: cT scan for quality assurance reviewed  PLAN: present time patient is doing well I've assured him his symptoms are related to prostate currently undergoing  treatment with low dose rate brachytherapy. I've increased his Flomax to twice a day. I've otherwise order a PSA and about 3-4 months with a follow-up shortly thereafter.Patient or has follow-up appointments with urology. Patient knows to call with any concerns. Interpreter was present for our consultation.  I would like to take this opportunity to thank you for allowing me to participate in the care of your patient.Noreene Filbert, MD

## 2018-04-04 DIAGNOSIS — C61 Malignant neoplasm of prostate: Secondary | ICD-10-CM | POA: Diagnosis present

## 2018-04-05 DIAGNOSIS — C61 Malignant neoplasm of prostate: Secondary | ICD-10-CM | POA: Diagnosis not present

## 2018-04-09 ENCOUNTER — Ambulatory Visit: Payer: 59 | Admitting: Urology

## 2018-06-06 ENCOUNTER — Telehealth: Payer: Self-pay | Admitting: Urology

## 2018-06-06 ENCOUNTER — Telehealth: Payer: Self-pay

## 2018-06-06 NOTE — Telephone Encounter (Signed)
Pt is having problems with retention.  He has a very thick accent, so I had trouble understanding him, but he did say he needed a refill of meds.  It sounded like he was saying Doxycycline,but I'm not sure.  Please call pt.

## 2018-06-06 NOTE — Telephone Encounter (Signed)
Patient called asking for a refill on his Colace. Patient was informed that Dr. Baruch Gouty was not the prescribing physician. However, patient was told that he was able to get Colace from his pharmacy with no prescription needed. Patient understood and had no further questions.

## 2018-06-06 NOTE — Telephone Encounter (Signed)
Interpreter services attempted to reach patient, his VM was full. Unable to reach patient.

## 2018-06-11 NOTE — Telephone Encounter (Signed)
Interpreter services contacted patient-verified no urinary issues. He wanted a prescription for Colace-which Dr. Olena Leatherwood office informed patient he can get over the counter.

## 2018-06-19 ENCOUNTER — Encounter: Payer: Self-pay | Admitting: *Deleted

## 2018-06-27 ENCOUNTER — Telehealth: Payer: Self-pay | Admitting: Radiation Oncology

## 2018-06-27 ENCOUNTER — Other Ambulatory Visit: Payer: Self-pay

## 2018-06-27 NOTE — Telephone Encounter (Signed)
Spoke to pt and completed travel screen. Also explained about addl screening questions they will be asked, new guidelines about mask req, no visitors, and fever checks °

## 2018-06-28 ENCOUNTER — Other Ambulatory Visit: Payer: Self-pay

## 2018-06-28 ENCOUNTER — Inpatient Hospital Stay: Payer: 59 | Attending: Radiation Oncology | Admitting: *Deleted

## 2018-06-28 DIAGNOSIS — C61 Malignant neoplasm of prostate: Secondary | ICD-10-CM | POA: Diagnosis not present

## 2018-06-28 LAB — PSA: Prostatic Specific Antigen: 0.69 ng/mL (ref 0.00–4.00)

## 2018-07-04 ENCOUNTER — Ambulatory Visit
Admission: RE | Admit: 2018-07-04 | Discharge: 2018-07-04 | Disposition: A | Discharge status: Home / Self Care | Payer: 59 | Source: Ambulatory Visit | Attending: Radiation Oncology | Admitting: Radiation Oncology

## 2018-07-04 ENCOUNTER — Ambulatory Visit: Payer: 59 | Admitting: Radiation Oncology

## 2018-07-04 ENCOUNTER — Encounter: Payer: Self-pay | Admitting: Radiation Oncology

## 2018-07-04 ENCOUNTER — Other Ambulatory Visit: Payer: Self-pay

## 2018-07-04 VITALS — BP 159/84 | HR 64 | Temp 97.8°F | Wt 167.1 lb

## 2018-07-04 DIAGNOSIS — K5900 Constipation, unspecified: Secondary | ICD-10-CM | POA: Insufficient documentation

## 2018-07-04 DIAGNOSIS — C61 Malignant neoplasm of prostate: Secondary | ICD-10-CM | POA: Diagnosis not present

## 2018-07-04 DIAGNOSIS — Z923 Personal history of irradiation: Secondary | ICD-10-CM | POA: Diagnosis not present

## 2018-07-04 NOTE — Progress Notes (Signed)
Radiation Oncology Follow up Note  Name: Anthony Castaneda   Date:   07/04/2018 MRN:  [id number] DOB: 09-Nov-1950    This 68 y.o. male presents to the clinic today for 62-month follow-up status post I-125 interstitial implant for stage IIb adenocarcinoma the prostate.  REFERRING PROVIDER: Theotis Burrow*  HPI: Patient is a 68 year old male now about 4 months having completed I-125 interstitial implant for stage IIb adenocarcinoma the prostate Gleason score of 7 (3+4) presenting with a PSA of 7.3.  He is seen today in routine follow-up states he has trouble voiding although he is discontinued his Flomax.  He also states he is tends towards constipation.  His most recent PSA is 0.69.Marland Kitchen  COMPLICATIONS OF TREATMENT: none  FOLLOW UP COMPLIANCE: keeps appointments   PHYSICAL EXAM:  BP (!) 159/84   Pulse 64   Temp 97.8 F (36.6 C)   Wt 167 lb 1.7 oz (75.8 kg)   BMI 27.81 kg/m  Well-developed well-nourished patient in NAD. HEENT reveals PERLA, EOMI, discs not visualized.  Oral cavity is clear. No oral mucosal lesions are identified. Neck is clear without evidence of cervical or supraclavicular adenopathy. Lungs are clear to A&P. Cardiac examination is essentially unremarkable with regular rate and rhythm without murmur rub or thrill. Abdomen is benign with no organomegaly or masses noted. Motor sensory and DTR levels are equal and symmetric in the upper and lower extremities. Cranial nerves II through XII are grossly intact. Proprioception is intact. No peripheral adenopathy or edema is identified. No motor or sensory levels are noted. Crude visual fields are within normal range.  RADIOLOGY RESULTS: No current films for review  PLAN: Present time with a long discussion with the patient today his constipation is not related to radiation I have suggested fiber in his diet or laxative.  Again he reviewed his hesitant to go back on Flomax have suggested cranberry juice is much as possible.  I also  stated that over time his urinary function will improve.  I have asked to see him back in 6 months with a PSA at that time.  Patient knows to call sooner with any concerns.  He still has Flomax and again I have asked him to restart that half an hour after dinner each night.  I would like to take this opportunity to thank you for allowing me to participate in the care of your patient.Noreene Filbert, MD

## 2018-07-10 ENCOUNTER — Other Ambulatory Visit: Payer: 59

## 2018-07-15 ENCOUNTER — Ambulatory Visit: Payer: 59 | Admitting: Urology

## 2019-01-16 ENCOUNTER — Other Ambulatory Visit: Payer: Self-pay

## 2019-01-16 DIAGNOSIS — C61 Malignant neoplasm of prostate: Secondary | ICD-10-CM

## 2019-01-17 ENCOUNTER — Inpatient Hospital Stay: Payer: Medicare Other | Attending: Radiation Oncology

## 2019-01-17 ENCOUNTER — Other Ambulatory Visit: Payer: Self-pay

## 2019-01-17 DIAGNOSIS — C61 Malignant neoplasm of prostate: Secondary | ICD-10-CM | POA: Insufficient documentation

## 2019-01-17 LAB — PSA: Prostatic Specific Antigen: 0.4 ng/mL (ref 0.00–4.00)

## 2019-01-22 ENCOUNTER — Ambulatory Visit: Payer: 59 | Admitting: Radiation Oncology

## 2019-01-24 ENCOUNTER — Ambulatory Visit
Admission: RE | Admit: 2019-01-24 | Discharge: 2019-01-24 | Disposition: A | Discharge status: Home / Self Care | Payer: Medicare Other | Source: Ambulatory Visit | Attending: Radiation Oncology | Admitting: Radiation Oncology

## 2019-01-24 ENCOUNTER — Other Ambulatory Visit: Payer: Self-pay | Admitting: *Deleted

## 2019-01-24 ENCOUNTER — Other Ambulatory Visit: Payer: Self-pay

## 2019-01-24 VITALS — BP 147/84 | HR 66 | Temp 95.3°F | Resp 16 | Wt 169.3 lb

## 2019-01-24 DIAGNOSIS — Z923 Personal history of irradiation: Secondary | ICD-10-CM | POA: Diagnosis not present

## 2019-01-24 DIAGNOSIS — C61 Malignant neoplasm of prostate: Secondary | ICD-10-CM | POA: Diagnosis present

## 2019-01-24 MED ORDER — SILDENAFIL CITRATE 25 MG PO TABS: 25.0000 mg | ORAL_TABLET | Freq: Every day | ORAL | 6 refills | Status: DC | PRN

## 2019-01-24 NOTE — Progress Notes (Signed)
Radiation Oncology Follow up Note  Name: Anthony Castaneda   Date:   01/24/2019 MRN:  [id number] DOB: 1950/07/01    This 69 y.o. male presents to the clinic today for 1 year follow-up status post I-125 interstitial implant for Gleason 7 (3+4) presenting with a PSA of 7.3.  REFERRING PROVIDER: Theotis Burrow*  HPI: Patient is a Spanish speaking patient accompanied by interpreter now seen at 1 year having completed I-125 interstitial implant for Gleason 7 (3+4) adenocarcinoma the prostate seen today in routine follow-up he is doing fairly well.  He specifically denies any increased lower urinary tract symptoms diarrhea or fatigue.  He has over the past several months experience erectile dysfunction.  His most recent PSA is 0.4  COMPLICATIONS OF TREATMENT: none  FOLLOW UP COMPLIANCE: keeps appointments   PHYSICAL EXAM:  BP (!) 147/84   Pulse 66   Temp (!) 95.3 F (35.2 C)   Resp 16   Wt 169 lb 4.8 oz (76.8 kg)   SpO2 96%   BMI 28.17 kg/m  Well-developed well-nourished patient in NAD. HEENT reveals PERLA, EOMI, discs not visualized.  Oral cavity is clear. No oral mucosal lesions are identified. Neck is clear without evidence of cervical or supraclavicular adenopathy. Lungs are clear to A&P. Cardiac examination is essentially unremarkable with regular rate and rhythm without murmur rub or thrill. Abdomen is benign with no organomegaly or masses noted. Motor sensory and DTR levels are equal and symmetric in the upper and lower extremities. Cranial nerves II through XII are grossly intact. Proprioception is intact. No peripheral adenopathy or edema is identified. No motor or sensory levels are noted. Crude visual fields are within normal range.  RADIOLOGY RESULTS: No current films for review  PLAN: Present time is to start the patient on a trial of generic Viagra.  Should that not be successful he will see urology for evaluation of his erectile dysfunction.  Otherwise he is under excellent  biochemical control of his prostate cancer we will see him back in 1 year with a follow-up PSA at that time.  Patient knows to call with any concerns.  I would like to take this opportunity to thank you for allowing me to participate in the care of your patient.Noreene Filbert, MD

## 2020-01-23 ENCOUNTER — Inpatient Hospital Stay: Payer: Medicare Other | Attending: Radiation Oncology

## 2020-01-23 DIAGNOSIS — C61 Malignant neoplasm of prostate: Secondary | ICD-10-CM | POA: Insufficient documentation

## 2020-01-23 LAB — PSA: Prostatic Specific Antigen: 0.44 ng/mL (ref 0.00–4.00)

## 2020-01-30 ENCOUNTER — Encounter: Payer: Self-pay | Admitting: Radiation Oncology

## 2020-01-30 ENCOUNTER — Ambulatory Visit
Admission: RE | Admit: 2020-01-30 | Discharge: 2020-01-30 | Disposition: A | Discharge status: Home / Self Care | Payer: Medicare Other | Source: Ambulatory Visit | Attending: Radiation Oncology | Admitting: Radiation Oncology

## 2020-01-30 ENCOUNTER — Other Ambulatory Visit: Payer: Self-pay

## 2020-01-30 VITALS — BP 142/79 | HR 68 | Temp 95.9°F | Resp 16 | Wt 173.8 lb

## 2020-01-30 DIAGNOSIS — N529 Male erectile dysfunction, unspecified: Secondary | ICD-10-CM | POA: Diagnosis not present

## 2020-01-30 DIAGNOSIS — Z923 Personal history of irradiation: Secondary | ICD-10-CM | POA: Insufficient documentation

## 2020-01-30 DIAGNOSIS — C61 Malignant neoplasm of prostate: Secondary | ICD-10-CM | POA: Insufficient documentation

## 2020-01-30 NOTE — Progress Notes (Signed)
Radiation Oncology Follow up Note  Name: Anthony Castaneda   Date:   01/30/2020 MRN:  [id number] DOB: 21-Jan-1950    This 70 y.o. male presents to the clinic today for 2-year follow-up status post I-125 interstitial implant for Gleason 7 (3+4) adenocarcinoma the prostate presenting with a PSA of 7.3..  REFERRING PROVIDER: Theotis Burrow*  HPI: Patient is a 70 year old male now about 2 years having pleated I-125 interstitial implant for Gleason 7 adenocarcinoma the prostate.  Seen today in routine follow-up he is doing well specifically denies any increased lower urinary tract symptoms diarrhea he does have erectile dysfunction did tried some generic Viagra without significant benefit.  His most recent PSA is.  0.44 almost identical to 1 year prior.  COMPLICATIONS OF TREATMENT: none  FOLLOW UP COMPLIANCE: keeps appointments   PHYSICAL EXAM:  BP (!) 142/79 (BP Location: Left Arm, Patient Position: Sitting)    Pulse 68    Temp (!) 95.9 F (35.5 C) (Tympanic)    Resp 16    Wt 173 lb 12.8 oz (78.8 kg)    BMI 28.92 kg/m  Well-developed well-nourished patient in NAD. HEENT reveals PERLA, EOMI, discs not visualized.  Oral cavity is clear. No oral mucosal lesions are identified. Neck is clear without evidence of cervical or supraclavicular adenopathy. Lungs are clear to A&P. Cardiac examination is essentially unremarkable with regular rate and rhythm without murmur rub or thrill. Abdomen is benign with no organomegaly or masses noted. Motor sensory and DTR levels are equal and symmetric in the upper and lower extremities. Cranial nerves II through XII are grossly intact. Proprioception is intact. No peripheral adenopathy or edema is identified. No motor or sensory levels are noted. Crude visual fields are within normal range.  RADIOLOGY RESULTS: No current films to review  PLAN:At the present time patient is doing well under excellent biochemical control of his prostate cancer.  I am referring him  back to Dr. Jeb Levering to discuss his erectile dysfunction.  Otherwise have asked to see him back in 1 year for follow-up with a PSA at that time.  Patient knows to call sooner with any concerns.  I would like to take this opportunity to thank you for allowing me to participate in the care of your patient.Noreene Filbert, MD

## 2021-01-19 ENCOUNTER — Other Ambulatory Visit: Payer: Self-pay | Admitting: *Deleted

## 2021-01-19 DIAGNOSIS — C61 Malignant neoplasm of prostate: Secondary | ICD-10-CM

## 2021-01-21 ENCOUNTER — Other Ambulatory Visit: Payer: Medicare Other

## 2021-01-24 ENCOUNTER — Inpatient Hospital Stay: Payer: Medicare HMO | Attending: Radiation Oncology

## 2021-01-24 ENCOUNTER — Other Ambulatory Visit: Payer: Self-pay

## 2021-01-24 DIAGNOSIS — C61 Malignant neoplasm of prostate: Secondary | ICD-10-CM | POA: Insufficient documentation

## 2021-01-24 LAB — PSA: Prostatic Specific Antigen: 0.87 ng/mL (ref 0.00–4.00)

## 2021-01-31 ENCOUNTER — Encounter: Payer: Self-pay | Admitting: Radiation Oncology

## 2021-01-31 ENCOUNTER — Other Ambulatory Visit: Payer: Self-pay

## 2021-01-31 ENCOUNTER — Ambulatory Visit
Admission: RE | Admit: 2021-01-31 | Discharge: 2021-01-31 | Disposition: A | Discharge status: Home / Self Care | Payer: Medicare HMO | Source: Ambulatory Visit | Attending: Radiation Oncology | Admitting: Radiation Oncology

## 2021-01-31 VITALS — BP 127/75 | HR 73 | Temp 96.2°F | Wt 172.1 lb

## 2021-01-31 DIAGNOSIS — C61 Malignant neoplasm of prostate: Secondary | ICD-10-CM | POA: Diagnosis not present

## 2021-01-31 DIAGNOSIS — Z923 Personal history of irradiation: Secondary | ICD-10-CM | POA: Insufficient documentation

## 2021-01-31 DIAGNOSIS — Z08 Encounter for follow-up examination after completed treatment for malignant neoplasm: Secondary | ICD-10-CM | POA: Diagnosis not present

## 2021-01-31 DIAGNOSIS — R9721 Rising PSA following treatment for malignant neoplasm of prostate: Secondary | ICD-10-CM | POA: Insufficient documentation

## 2021-01-31 NOTE — Progress Notes (Signed)
Radiation Oncology Follow up Note  Name: Anthony Castaneda   Date:   01/31/2021 MRN:  [id number] DOB: Dec 28, 1950    This 71 y.o. male presents to the clinic today for 3-year follow-up status post I-125 interstitial implant for Gleason 7 (3+4) adenocarcinoma presenting with a PSA of 7.3.  REFERRING PROVIDER: Theotis Burrow*  HPI: Patient is a 71 year old male now out 3 years having completed I-125 interstitial implant for Gleason 7 adenocarcinoma seen today in routine follow-up he is asymptomatic specifically denies any increased lower urinary tract symptoms diarrhea or fatigue.  His PSA has doubled over the past year from.  0.441-year prior 2.87 last week.  COMPLICATIONS OF TREATMENT: none  FOLLOW UP COMPLIANCE: keeps appointments   PHYSICAL EXAM:  There were no vitals taken for this visit. Well-developed well-nourished patient in NAD. HEENT reveals PERLA, EOMI, discs not visualized.  Oral cavity is clear. No oral mucosal lesions are identified. Neck is clear without evidence of cervical or supraclavicular adenopathy. Lungs are clear to A&P. Cardiac examination is essentially unremarkable with regular rate and rhythm without murmur rub or thrill. Abdomen is benign with no organomegaly or masses noted. Motor sensory and DTR levels are equal and symmetric in the upper and lower extremities. Cranial nerves II through XII are grossly intact. Proprioception is intact. No peripheral adenopathy or edema is identified. No motor or sensory levels are noted. Crude visual fields are within normal range.  RADIOLOGY RESULTS: No current films to review  PLAN: Present time patient is asymptomatic although PSA is doubled over the past year.  I am going to repeat it in 6 months should it show progression at that time will refer to medical oncology for consideration of systemic treatment most likely ADT therapy plus Abbi.  Patient comprehends my recommendations well.  Follow-up was made.  I would like to  take this opportunity to thank you for allowing me to participate in the care of your patient.Noreene Filbert, MD

## 2021-02-09 DEATH — deceased

## 2021-07-08 ENCOUNTER — Inpatient Hospital Stay: Payer: Self-pay | Attending: Radiation Oncology

## 2021-07-15 ENCOUNTER — Ambulatory Visit: Payer: Self-pay | Attending: Radiation Oncology | Admitting: Radiation Oncology
